# Patient Record
Sex: Female | Born: 1970 | Race: Black or African American | Hispanic: No | Marital: Single | Smoking: Never smoker
Health system: Southern US, Community
[De-identification: ages and names within clinical notes are randomized; demographics above are authoritative.]

## PROBLEM LIST (undated history)

## (undated) DIAGNOSIS — K529 Noninfective gastroenteritis and colitis, unspecified: Secondary | ICD-10-CM

## (undated) DIAGNOSIS — E119 Type 2 diabetes mellitus without complications: Secondary | ICD-10-CM

## (undated) DIAGNOSIS — I10 Essential (primary) hypertension: Secondary | ICD-10-CM

## (undated) DIAGNOSIS — K859 Acute pancreatitis without necrosis or infection, unspecified: Secondary | ICD-10-CM

## (undated) DIAGNOSIS — M1990 Unspecified osteoarthritis, unspecified site: Secondary | ICD-10-CM

## (undated) DIAGNOSIS — N3010 Interstitial cystitis (chronic) without hematuria: Secondary | ICD-10-CM

## (undated) DIAGNOSIS — G43909 Migraine, unspecified, not intractable, without status migrainosus: Secondary | ICD-10-CM

## (undated) DIAGNOSIS — K219 Gastro-esophageal reflux disease without esophagitis: Secondary | ICD-10-CM

## (undated) DIAGNOSIS — G2581 Restless legs syndrome: Secondary | ICD-10-CM

## (undated) DIAGNOSIS — E669 Obesity, unspecified: Secondary | ICD-10-CM

## (undated) HISTORY — PX: APPENDECTOMY: SHX54

## (undated) HISTORY — PX: CHOLECYSTECTOMY: SHX55

## (undated) HISTORY — PX: ABDOMINAL HYSTERECTOMY: SHX81

---

## 2012-05-30 ENCOUNTER — Emergency Department (HOSPITAL_BASED_OUTPATIENT_CLINIC_OR_DEPARTMENT_OTHER)
Admission: EM | Admit: 2012-05-30 | Discharge: 2012-05-30 | Disposition: A | Discharge status: Home / Self Care | Payer: No Typology Code available for payment source | Attending: Emergency Medicine | Admitting: Emergency Medicine

## 2012-05-30 ENCOUNTER — Encounter (HOSPITAL_BASED_OUTPATIENT_CLINIC_OR_DEPARTMENT_OTHER): Payer: Self-pay | Admitting: *Deleted

## 2012-05-30 ENCOUNTER — Emergency Department (HOSPITAL_BASED_OUTPATIENT_CLINIC_OR_DEPARTMENT_OTHER): Payer: No Typology Code available for payment source

## 2012-05-30 DIAGNOSIS — M7989 Other specified soft tissue disorders: Secondary | ICD-10-CM | POA: Insufficient documentation

## 2012-05-30 DIAGNOSIS — R51 Headache: Secondary | ICD-10-CM | POA: Insufficient documentation

## 2012-05-30 DIAGNOSIS — T1490XA Injury, unspecified, initial encounter: Secondary | ICD-10-CM | POA: Insufficient documentation

## 2012-05-30 DIAGNOSIS — I10 Essential (primary) hypertension: Secondary | ICD-10-CM | POA: Insufficient documentation

## 2012-05-30 DIAGNOSIS — R42 Dizziness and giddiness: Secondary | ICD-10-CM | POA: Insufficient documentation

## 2012-05-30 HISTORY — DX: Essential (primary) hypertension: I10

## 2012-05-30 HISTORY — DX: Restless legs syndrome: G25.81

## 2012-05-30 HISTORY — DX: Gastro-esophageal reflux disease without esophagitis: K21.9

## 2012-05-30 LAB — BASIC METABOLIC PANEL
BUN: 14 mg/dL (ref 6–23)
CO2: 29 mEq/L (ref 19–32)
Calcium: 9.4 mg/dL (ref 8.4–10.5)
GFR calc non Af Amer: >90 mL/min (ref >90)
Glucose, Bld: 105 mg/dL — ABNORMAL HIGH (ref 70–99)

## 2012-05-30 MED ORDER — ENOXAPARIN SODIUM 150 MG/ML ~~LOC~~ SOLN
1.0000 mg/kg | Freq: Once | SUBCUTANEOUS | Status: AC
  Administered 2012-05-30: 102 mg via SUBCUTANEOUS

## 2012-05-30 MED ORDER — METHOCARBAMOL 500 MG PO TABS: 1000.0000 mg | ORAL_TABLET | Freq: Four times a day (QID) | ORAL | Status: AC

## 2012-05-30 MED ORDER — ENOXAPARIN SODIUM 150 MG/ML ~~LOC~~ SOLN: 1.0000 mg/kg | Freq: Once | SUBCUTANEOUS | Status: DC

## 2012-05-30 MED ORDER — ENOXAPARIN SODIUM 120 MG/0.8ML ~~LOC~~ SOLN
SUBCUTANEOUS | Status: AC
  Filled 2012-05-30: qty 0.8
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
## 2012-05-31 ENCOUNTER — Ambulatory Visit (HOSPITAL_BASED_OUTPATIENT_CLINIC_OR_DEPARTMENT_OTHER)
Admit: 2012-05-31 | Discharge: 2012-05-31 | Disposition: A | Discharge status: Home / Self Care | Payer: Medicaid Other | Attending: Emergency Medicine | Admitting: Emergency Medicine

## 2012-05-31 DIAGNOSIS — M7989 Other specified soft tissue disorders: Secondary | ICD-10-CM

## 2012-05-31 DIAGNOSIS — R609 Edema, unspecified: Secondary | ICD-10-CM | POA: Insufficient documentation

## 2012-06-03 ENCOUNTER — Telehealth (HOSPITAL_BASED_OUTPATIENT_CLINIC_OR_DEPARTMENT_OTHER): Payer: Self-pay | Admitting: *Deleted

## 2012-06-04 NOTE — ED Notes (Signed)
Called pt to give normal [place] result.

## 2012-06-06 NOTE — ED Provider Notes (Signed)
Medical screening examination/treatment/procedure(s) were performed by non-physician practitioner and as supervising physician I was immediately available for consultation/collaboration.  Coyle Stordahl, MD 06/06/12 0000 

## 2013-01-25 ENCOUNTER — Encounter (HOSPITAL_BASED_OUTPATIENT_CLINIC_OR_DEPARTMENT_OTHER): Payer: Self-pay

## 2013-01-25 ENCOUNTER — Emergency Department (HOSPITAL_BASED_OUTPATIENT_CLINIC_OR_DEPARTMENT_OTHER)
Admission: EM | Admit: 2013-01-25 | Discharge: 2013-01-25 | Disposition: A | Discharge status: Home / Self Care | Payer: Medicaid Other | Attending: Emergency Medicine | Admitting: Emergency Medicine

## 2013-01-25 ENCOUNTER — Emergency Department (HOSPITAL_BASED_OUTPATIENT_CLINIC_OR_DEPARTMENT_OTHER): Payer: Medicaid Other

## 2013-01-25 DIAGNOSIS — R51 Headache: Secondary | ICD-10-CM | POA: Insufficient documentation

## 2013-01-25 DIAGNOSIS — M25519 Pain in unspecified shoulder: Secondary | ICD-10-CM | POA: Insufficient documentation

## 2013-01-25 DIAGNOSIS — Z8739 Personal history of other diseases of the musculoskeletal system and connective tissue: Secondary | ICD-10-CM | POA: Insufficient documentation

## 2013-01-25 DIAGNOSIS — E669 Obesity, unspecified: Secondary | ICD-10-CM | POA: Insufficient documentation

## 2013-01-25 DIAGNOSIS — K219 Gastro-esophageal reflux disease without esophagitis: Secondary | ICD-10-CM | POA: Insufficient documentation

## 2013-01-25 DIAGNOSIS — Z79899 Other long term (current) drug therapy: Secondary | ICD-10-CM | POA: Insufficient documentation

## 2013-01-25 DIAGNOSIS — I10 Essential (primary) hypertension: Secondary | ICD-10-CM | POA: Insufficient documentation

## 2013-01-25 DIAGNOSIS — M542 Cervicalgia: Secondary | ICD-10-CM | POA: Insufficient documentation

## 2013-01-25 HISTORY — DX: Obesity, unspecified: E66.9

## 2013-01-25 LAB — BASIC METABOLIC PANEL
BUN: 10 mg/dL (ref 6–23)
Creatinine, Ser: 0.8 mg/dL (ref 0.50–1.10)
GFR calc Af Amer: >90 mL/min (ref >90)
GFR calc non Af Amer: >90 mL/min (ref >90)
Glucose, Bld: 81 mg/dL (ref 70–99)
Potassium: 3.8 mEq/L (ref 3.5–5.1)

## 2013-01-25 MED ORDER — HYDROCODONE-ACETAMINOPHEN 5-325 MG PO TABS: 2.0000 | ORAL_TABLET | Freq: Four times a day (QID) | ORAL | Status: DC | PRN

## 2013-01-25 MED ORDER — ONDANSETRON HCL 4 MG/2ML IJ SOLN
4.0000 mg | Freq: Once | INTRAMUSCULAR | Status: AC
  Administered 2013-01-25: 4 mg via INTRAVENOUS
  Filled 2013-01-25: qty 2

## 2013-01-25 MED ORDER — HYDROMORPHONE HCL PF 1 MG/ML IJ SOLN
1.0000 mg | Freq: Once | INTRAMUSCULAR | Status: AC
  Administered 2013-01-25: 1 mg via INTRAVENOUS
  Filled 2013-01-25: qty 1

## 2013-01-25 MED ORDER — METOCLOPRAMIDE HCL 10 MG PO TABS: 10.0000 mg | ORAL_TABLET | Freq: Four times a day (QID) | ORAL | Status: DC

## 2013-01-25 NOTE — ED Provider Notes (Signed)
History  This chart was scribed for Charles B. Bernette Mayers, MD by Shari Heritage, ED Scribe. The patient was seen in room MH05/MH05. Patient's care was started at 1721.   CSN: [id number]  Arrival date & time 01/25/13  1644   First MD Initiated Contact with Patient 01/25/13 1721      Chief Complaint  Patient presents with  . Headache    The history is provided by the patient. No language interpreter was used.    HPI Comments: Selena Hopkins is a 42 y.o. female who presents to the Emergency Department complaining of gradual, moderate to severe, constant, diffuse headache onset 2 days ago. She also reports right neck and shoulder pain that began at the same times. Patient says that she was doing normal, daily activities when pain began. She states that she also began having nausea and vomiting this morning.There is no hematemesis. Patient denies fever or abdominal pain. Patient took Tylenol this morning with no relief. Patient says that she hadn't had a headache for 6-7 months prior to this episode and past headaches have never been this severe. She denies any recent falls, injuries or trauma. She has a history of hypertension, GERD and pancreatitis.    Past Medical History  Diagnosis Date  . GERD (gastroesophageal reflux disease)   . Hypertension   . Restless leg syndrome   . Obesity     Past Surgical History  Procedure Laterality Date  . Cholecystectomy    . Abdominal hysterectomy    . Appendectomy      History reviewed. No pertinent family history.  History  Substance Use Topics  . Smoking status: Never Smoker   . Smokeless tobacco: Not on file  . Alcohol Use: No    OB History   Grav Para Term Preterm Abortions TAB SAB Ect Mult Living                  Review of Systems A complete 10 system review of systems was obtained and all systems are negative except as noted in the HPI and PMH.    Allergies  Aspirin and Ibuprofen  Home Medications   Current Outpatient Rx  Name   Route  Sig  Dispense  Refill  . acetaminophen (TYLENOL) 500 MG tablet   Oral   Take 500 mg by mouth every 6 (six) hours as needed for pain.         Marland Kitchen dicyclomine (BENTYL) 10 MG capsule   Oral   Take 10 mg by mouth every 4 (four) hours as needed.         . hydrochlorothiazide (HYDRODIURIL) 25 MG tablet   Oral   Take 25 mg by mouth daily.         Marland Kitchen lisinopril (PRINIVIL,ZESTRIL) 5 MG tablet   Oral   Take 5 mg by mouth daily.         . metoCLOPramide (REGLAN) 10 MG tablet   Oral   Take 10 mg by mouth 4 (four) times daily.         . pantoprazole (PROTONIX) 40 MG tablet   Oral   Take 40 mg by mouth daily.         Marland Kitchen rOPINIRole (REQUIP) 0.5 MG tablet   Oral   Take 0.5 mg by mouth 3 (three) times daily.         . TRAZODONE HCL PO   Oral   Take by mouth at bedtime as needed.  Triage Vitals: BP 134/72  Pulse 80  Temp(Src) 98.5 F (36.9 C) (Oral)  Resp 18  Ht 5\' 4"  (1.626 m)  Wt 225 lb (102.059 kg)  BMI 38.6 kg/m2  SpO2 99%  Physical Exam  Nursing note and vitals reviewed. Constitutional: She is oriented to person, place, and time. She appears well-developed and well-nourished.  HENT:  Head: Normocephalic and atraumatic.  Eyes: EOM are normal. Pupils are equal, round, and reactive to light.  Neck: Normal range of motion. Neck supple.  Cardiovascular: Normal rate, normal heart sounds and intact distal pulses.   Pulmonary/Chest: Effort normal and breath sounds normal.  Abdominal: Bowel sounds are normal. She exhibits no distension. There is no tenderness.  Musculoskeletal: Normal range of motion. She exhibits no edema and no tenderness.  Neurological: She is alert and oriented to person, place, and time. She has normal strength and normal reflexes. No cranial nerve deficit or sensory deficit.  Skin: Skin is warm and dry. No rash noted.  Psychiatric: She has a normal mood and affect.    ED Course  Procedures (including critical care  time) DIAGNOSTIC STUDIES: Oxygen Saturation is 99% on room air, normal by my interpretation.    COORDINATION OF CARE: 5:31 PM- Patient informed of current plan for treatment and evaluation and agrees with plan at this time.    Labs Reviewed  BASIC METABOLIC PANEL    Ct Head Wo Contrast  01/25/2013  *RADIOLOGY REPORT*  Clinical Data: headache, vomiting  CT HEAD WITHOUT CONTRAST  Technique:  Contiguous axial images were obtained from the base of the skull through the vertex without contrast.  Comparison:05/30/12  Findings: Calvarium intact.  No hemorrhage or extraaxial fluid.  No mass, infarct, or hydrocephalus.  Normal sulcation.  IMPRESSION: Normal CT of the head.   Original Report Authenticated By: Esperanza Heir, M.D.      No diagnosis found.    MDM  Pt feeling better, headache and nausea resolved. Doubt acute intracranial process given timing and gradual onset. No concern for CAH or meningitis. Advised to follow up with PCP as needed.       I personally performed the services described in this documentation, which was scribed in my presence. The recorded information has been reviewed and is accurate.     Charles B. Bernette Mayers, MD 01/25/13 2018

## 2013-01-25 NOTE — ED Notes (Signed)
Pt states that she has severe headache, R neck and R shoulder pain.  Pt states that she had taken tylenol for the pain with no relief, nausea and vomiting caused her not to keep medication down.

## 2013-11-18 ENCOUNTER — Emergency Department (HOSPITAL_BASED_OUTPATIENT_CLINIC_OR_DEPARTMENT_OTHER)
Admission: EM | Admit: 2013-11-18 | Discharge: 2013-11-18 | Disposition: A | Discharge status: Home / Self Care | Payer: Medicaid Other | Attending: Emergency Medicine | Admitting: Emergency Medicine

## 2013-11-18 ENCOUNTER — Encounter (HOSPITAL_BASED_OUTPATIENT_CLINIC_OR_DEPARTMENT_OTHER): Payer: Self-pay | Admitting: Emergency Medicine

## 2013-11-18 ENCOUNTER — Emergency Department (HOSPITAL_BASED_OUTPATIENT_CLINIC_OR_DEPARTMENT_OTHER): Payer: Medicaid Other

## 2013-11-18 DIAGNOSIS — R109 Unspecified abdominal pain: Secondary | ICD-10-CM

## 2013-11-18 DIAGNOSIS — E669 Obesity, unspecified: Secondary | ICD-10-CM | POA: Insufficient documentation

## 2013-11-18 DIAGNOSIS — Z79899 Other long term (current) drug therapy: Secondary | ICD-10-CM | POA: Insufficient documentation

## 2013-11-18 DIAGNOSIS — K219 Gastro-esophageal reflux disease without esophagitis: Secondary | ICD-10-CM | POA: Insufficient documentation

## 2013-11-18 DIAGNOSIS — Z90710 Acquired absence of both cervix and uterus: Secondary | ICD-10-CM | POA: Insufficient documentation

## 2013-11-18 DIAGNOSIS — R1084 Generalized abdominal pain: Secondary | ICD-10-CM | POA: Insufficient documentation

## 2013-11-18 DIAGNOSIS — R110 Nausea: Secondary | ICD-10-CM | POA: Insufficient documentation

## 2013-11-18 DIAGNOSIS — Z9089 Acquired absence of other organs: Secondary | ICD-10-CM | POA: Insufficient documentation

## 2013-11-18 DIAGNOSIS — K589 Irritable bowel syndrome without diarrhea: Secondary | ICD-10-CM | POA: Insufficient documentation

## 2013-11-18 DIAGNOSIS — E119 Type 2 diabetes mellitus without complications: Secondary | ICD-10-CM | POA: Insufficient documentation

## 2013-11-18 DIAGNOSIS — I10 Essential (primary) hypertension: Secondary | ICD-10-CM | POA: Insufficient documentation

## 2013-11-18 DIAGNOSIS — G2581 Restless legs syndrome: Secondary | ICD-10-CM | POA: Insufficient documentation

## 2013-11-18 DIAGNOSIS — Z8711 Personal history of peptic ulcer disease: Secondary | ICD-10-CM | POA: Insufficient documentation

## 2013-11-18 HISTORY — DX: Acute pancreatitis without necrosis or infection, unspecified: K85.90

## 2013-11-18 HISTORY — DX: Noninfective gastroenteritis and colitis, unspecified: K52.9

## 2013-11-18 HISTORY — DX: Type 2 diabetes mellitus without complications: E11.9

## 2013-11-18 LAB — COMPREHENSIVE METABOLIC PANEL
Albumin: 3.8 g/dL (ref 3.5–5.2)
Alkaline Phosphatase: 51 U/L (ref 39–117)
BUN: 12 mg/dL (ref 6–23)
CO2: 28 mEq/L (ref 19–32)
Chloride: 101 mEq/L (ref 96–112)
Creatinine, Ser: 0.9 mg/dL (ref 0.50–1.10)
GFR calc Af Amer: >90 mL/min (ref >90)
GFR calc non Af Amer: 78 mL/min — ABNORMAL LOW (ref >90)
Glucose, Bld: 92 mg/dL (ref 70–99)
Potassium: 3.8 mEq/L (ref 3.5–5.1)
Total Bilirubin: 0.2 mg/dL — ABNORMAL LOW (ref 0.3–1.2)

## 2013-11-18 LAB — URINALYSIS, ROUTINE W REFLEX MICROSCOPIC
Bilirubin Urine: NEGATIVE
Glucose, UA: NEGATIVE mg/dL
Ketones, ur: NEGATIVE mg/dL
Leukocytes, UA: NEGATIVE
Nitrite: NEGATIVE
Protein, ur: NEGATIVE mg/dL
Specific Gravity, Urine: 1.019 (ref 1.005–1.030)
Urobilinogen, UA: 0.2 mg/dL (ref 0.0–1.0)
pH: 6.5 (ref 5.0–8.0)

## 2013-11-18 LAB — CBC WITH DIFFERENTIAL/PLATELET
Basophils Relative: 0 % (ref 0–1)
Eosinophils Relative: 2 % (ref 0–5)
HCT: 33.8 % — ABNORMAL LOW (ref 36.0–46.0)
Hemoglobin: 11.5 g/dL — ABNORMAL LOW (ref 12.0–15.0)
Lymphocytes Relative: 46 % (ref 12–46)
Neutro Abs: 3.3 10*3/uL (ref 1.7–7.7)
Neutrophils Relative %: 44 % (ref 43–77)
RBC: 5.26 MIL/uL — ABNORMAL HIGH (ref 3.87–5.11)

## 2013-11-18 LAB — URINE MICROSCOPIC-ADD ON

## 2013-11-18 MED ORDER — PROMETHAZINE HCL 25 MG PO TABS: 25.0000 mg | ORAL_TABLET | Freq: Four times a day (QID) | ORAL | Status: DC | PRN

## 2013-11-18 MED ORDER — ONDANSETRON HCL 4 MG/2ML IJ SOLN
4.0000 mg | Freq: Once | INTRAMUSCULAR | Status: AC
  Administered 2013-11-18: 4 mg via INTRAMUSCULAR
  Filled 2013-11-18: qty 2

## 2013-11-18 MED ORDER — IOHEXOL 300 MG/ML  SOLN
100.0000 mL | Freq: Once | INTRAMUSCULAR | Status: AC | PRN
  Administered 2013-11-18: 100 mL via INTRAVENOUS

## 2013-11-18 MED ORDER — IOHEXOL 300 MG/ML  SOLN
50.0000 mL | Freq: Once | INTRAMUSCULAR | Status: AC | PRN
  Administered 2013-11-18: 50 mL via ORAL

## 2013-11-18 MED ORDER — HYDROMORPHONE HCL PF 1 MG/ML IJ SOLN
1.0000 mg | Freq: Once | INTRAMUSCULAR | Status: AC
  Administered 2013-11-18: 1 mg via INTRAVENOUS
  Filled 2013-11-18: qty 1

## 2013-11-18 MED ORDER — SODIUM CHLORIDE 0.9 % IV SOLN
Freq: Once | INTRAVENOUS | Status: AC
  Administered 2013-11-18: 18:00:00 via INTRAVENOUS

## 2013-11-18 MED ORDER — HYDROCODONE-ACETAMINOPHEN 5-325 MG PO TABS: 2.0000 | ORAL_TABLET | ORAL | Status: DC | PRN

## 2013-11-18 NOTE — ED Notes (Signed)
Pt. Drove self to ED.  Pt. Will try to get a ride home.  If Pt. Finds a ride she will let RN know so she can have pain meds.

## 2013-11-18 NOTE — ED Notes (Signed)
Pt drove self here to ED and will not get Dilaudid.

## 2013-11-18 NOTE — ED Notes (Signed)
Abdominal pain with nausea since this afternoon.  No diarrhea or fever.  No dysuria.

## 2013-11-18 NOTE — ED Provider Notes (Signed)
CSN: [id number]     Arrival date & time 11/18/13  1553 History   First MD Initiated Contact with Patient 11/18/13 1703     Chief Complaint  Patient presents with  . Abdominal Pain   (Consider location/radiation/quality/duration/timing/severity/associated sxs/prior Treatment) Patient is a 42 y.o. female presenting with abdominal pain. The history is provided by the patient. No language interpreter was used.  Abdominal Pain Pain location:  Generalized Pain radiates to:  Does not radiate Pain severity:  Moderate Timing:  Constant Progression:  Worsening Chronicity:  New Context: recent illness   Context: not alcohol use and not sick contacts   Relieved by:  Nothing Worsened by:  Nothing tried Ineffective treatments:  None tried Associated symptoms: nausea   Associated symptoms: no diarrhea, no fever and no vomiting   Risk factors: no alcohol abuse   Pt reports she has a history of a perforated ulcer.   Pt reports she is having similar pain.  Pt reports pain.    Past Medical History  Diagnosis Date  . GERD (gastroesophageal reflux disease)   . Hypertension   . Restless leg syndrome   . Obesity   . Diabetes mellitus without complication   . Inflammatory bowel diseases (IBD)   . Pancreatitis    Past Surgical History  Procedure Laterality Date  . Cholecystectomy    . Abdominal hysterectomy    . Appendectomy     No family history on file. History  Substance Use Topics  . Smoking status: Never Smoker   . Smokeless tobacco: Not on file  . Alcohol Use: No   OB History   Grav Para Term Preterm Abortions TAB SAB Ect Mult Living                 Review of Systems  Constitutional: Negative for fever.  Gastrointestinal: Positive for nausea and abdominal pain. Negative for vomiting and diarrhea.  All other systems reviewed and are negative.    Allergies  Aspirin and Ibuprofen  Home Medications   Current Outpatient Rx  Name  Route  Sig  Dispense  Refill  .  dicyclomine (BENTYL) 10 MG capsule   Oral   Take 10 mg by mouth every 4 (four) hours as needed.         . hydrochlorothiazide (HYDRODIURIL) 25 MG tablet   Oral   Take 25 mg by mouth daily.         Marland Kitchen lisinopril (PRINIVIL,ZESTRIL) 5 MG tablet   Oral   Take 5 mg by mouth daily.         Marland Kitchen lubiprostone (AMITIZA) 24 MCG capsule   Oral   Take 24 mcg by mouth 2 (two) times daily with a meal.         . metFORMIN (GLUCOPHAGE) 500 MG tablet   Oral   Take by mouth 2 (two) times daily with a meal.         . metoCLOPramide (REGLAN) 10 MG tablet   Oral   Take 1 tablet (10 mg total) by mouth 4 (four) times daily.   10 tablet   0   . pantoprazole (PROTONIX) 40 MG tablet   Oral   Take 40 mg by mouth daily.         Marland Kitchen rOPINIRole (REQUIP) 0.5 MG tablet   Oral   Take 0.5 mg by mouth 3 (three) times daily.         . TRAZODONE HCL PO   Oral   Take by mouth at bedtime  as needed.         . Vitamin D, Ergocalciferol, (DRISDOL) 50000 UNITS CAPS capsule   Oral   Take 50,000 Units by mouth every 7 (seven) days.         Marland Kitchen acetaminophen (TYLENOL) 500 MG tablet   Oral   Take 500 mg by mouth every 6 (six) hours as needed for pain.         Marland Kitchen HYDROcodone-acetaminophen (NORCO/VICODIN) 5-325 MG per tablet   Oral   Take 2 tablets by mouth every 6 (six) hours as needed for pain.   30 tablet   0    BP 160/86  Pulse 81  Temp(Src) 98.6 F (37 C) (Oral)  Resp 16  Wt 225 lb (102.059 kg)  SpO2 98% Physical Exam  Nursing note and vitals reviewed. Constitutional: She is oriented to person, place, and time. She appears well-developed and well-nourished.  HENT:  Head: Normocephalic and atraumatic.  Right Ear: External ear normal.  Left Ear: External ear normal.  Nose: Nose normal.  Mouth/Throat: Oropharynx is clear and moist.  Eyes: Conjunctivae and EOM are normal. Pupils are equal, round, and reactive to light.  Neck: Normal range of motion. Neck supple.  Cardiovascular:  Normal rate and normal heart sounds.   Pulmonary/Chest: Effort normal and breath sounds normal.  Abdominal: Soft. There is tenderness. There is guarding.  Musculoskeletal: Normal range of motion.  Neurological: She is alert and oriented to person, place, and time. She has normal reflexes.  Skin: Skin is warm.  Psychiatric: She has a normal mood and affect.    ED Course  Procedures (including critical care time) Labs Review Labs Reviewed  URINALYSIS, ROUTINE W REFLEX MICROSCOPIC - Abnormal; Notable for the following:    APPearance CLOUDY (*)    Hgb urine dipstick SMALL (*)    All other components within normal limits  CBC WITH DIFFERENTIAL - Abnormal; Notable for the following:    RBC 5.26 (*)    Hemoglobin 11.5 (*)    HCT 33.8 (*)    MCV 64.3 (*)    MCH 21.9 (*)    All other components within normal limits  COMPREHENSIVE METABOLIC PANEL - Abnormal; Notable for the following:    Total Bilirubin 0.2 (*)    GFR calc non Af Amer 78 (*)    All other components within normal limits  URINE MICROSCOPIC-ADD ON   Imaging Review No results found.  EKG Interpretation   None       MDM   1. Abdominal pain    Labs reviewed,  Ct scan is normal.   Pt advised to follow up with her MD for recheck.    Rx for hydrocodone and phenergan.       Lonia Skinner McGovern, PA-C 11/18/13 2034

## 2013-11-18 NOTE — ED Notes (Signed)
Pt. Reports she found a ride home.

## 2013-11-18 NOTE — ED Notes (Signed)
PA at bedside giving test results and discussing plan of care for DC. 

## 2013-11-19 NOTE — ED Provider Notes (Signed)
Medical screening examination/treatment/procedure(s) were performed by non-physician practitioner and as supervising physician I was immediately available for consultation/collaboration.  EKG Interpretation   None        Rainier Feuerborn F Bryon Parker, MD 11/19/13 1447 

## 2014-01-29 ENCOUNTER — Encounter (HOSPITAL_BASED_OUTPATIENT_CLINIC_OR_DEPARTMENT_OTHER): Payer: Self-pay | Admitting: Emergency Medicine

## 2014-01-29 ENCOUNTER — Emergency Department (HOSPITAL_BASED_OUTPATIENT_CLINIC_OR_DEPARTMENT_OTHER)
Admission: EM | Admit: 2014-01-29 | Discharge: 2014-01-30 | Disposition: A | Discharge status: Home / Self Care | Payer: Medicaid Other | Attending: Emergency Medicine | Admitting: Emergency Medicine

## 2014-01-29 DIAGNOSIS — G43809 Other migraine, not intractable, without status migrainosus: Secondary | ICD-10-CM | POA: Insufficient documentation

## 2014-01-29 DIAGNOSIS — Z79899 Other long term (current) drug therapy: Secondary | ICD-10-CM | POA: Insufficient documentation

## 2014-01-29 DIAGNOSIS — K219 Gastro-esophageal reflux disease without esophagitis: Secondary | ICD-10-CM | POA: Insufficient documentation

## 2014-01-29 DIAGNOSIS — E119 Type 2 diabetes mellitus without complications: Secondary | ICD-10-CM | POA: Insufficient documentation

## 2014-01-29 DIAGNOSIS — E669 Obesity, unspecified: Secondary | ICD-10-CM | POA: Insufficient documentation

## 2014-01-29 DIAGNOSIS — I10 Essential (primary) hypertension: Secondary | ICD-10-CM | POA: Insufficient documentation

## 2014-01-29 DIAGNOSIS — G2581 Restless legs syndrome: Secondary | ICD-10-CM | POA: Insufficient documentation

## 2014-01-29 DIAGNOSIS — G43901 Migraine, unspecified, not intractable, with status migrainosus: Secondary | ICD-10-CM

## 2014-01-29 HISTORY — DX: Hypercalcemia: E83.52

## 2014-01-29 HISTORY — DX: Migraine, unspecified, not intractable, without status migrainosus: G43.909

## 2014-01-29 NOTE — ED Notes (Signed)
Migraines x2 weeks.  Has been to pmd twice.  Got Toradol IM yesterday but it didn't help.

## 2014-01-30 MED ORDER — SODIUM CHLORIDE 0.9 % IV BOLUS (SEPSIS)
1000.0000 mL | Freq: Once | INTRAVENOUS | Status: AC
  Administered 2014-01-30: 1000 mL via INTRAVENOUS

## 2014-01-30 MED ORDER — VALPROATE SODIUM 500 MG/5ML IV SOLN
INTRAVENOUS | Status: AC
  Administered 2014-01-30: 500 mg
  Filled 2014-01-30: qty 5

## 2014-01-30 MED ORDER — DEXTROSE 5 % IV SOLN: 500.0000 mg | Freq: Once | INTRAVENOUS | Status: AC

## 2014-01-30 NOTE — ED Provider Notes (Signed)
CSN: [id number]     Arrival date & time 01/29/14  2317 History   First MD Initiated Contact with Patient 01/30/14 0103     Chief Complaint  Patient presents with  . Headache     (Consider location/radiation/quality/duration/timing/severity/associated sxs/prior Treatment) HPI This is a 43 year old female with a history of migraines. She is here with a two-week migraine which she describes as pressure over her entire head. The pain worsened yesterday and she was seen by her primary care physician. She was given a shot of Toradol without relief. The pain has been associated with nausea, vomiting and photophobia. There is no associated focal neurologic deficit. She states the pain is moderate to severe. Her pain is usually relieved by an infusion of Depakote.  Past Medical History  Diagnosis Date  . GERD (gastroesophageal reflux disease)   . Hypertension   . Restless leg syndrome   . Obesity   . Diabetes mellitus without complication   . Inflammatory bowel diseases (IBD)   . Pancreatitis   . Migraine   . Hypercalcemia    Past Surgical History  Procedure Laterality Date  . Cholecystectomy    . Abdominal hysterectomy    . Appendectomy     No family history on file. History  Substance Use Topics  . Smoking status: Never Smoker   . Smokeless tobacco: Not on file  . Alcohol Use: No   OB History   Grav Para Term Preterm Abortions TAB SAB Ect Mult Living                 Review of Systems  All other systems reviewed and are negative.   Allergies  Aspirin and Ibuprofen  Home Medications   Current Outpatient Rx  Name  Route  Sig  Dispense  Refill  . topiramate (TOPAMAX) 50 MG tablet   Oral   Take 50 mg by mouth 2 (two) times daily.         Marland Kitchen. acetaminophen (TYLENOL) 500 MG tablet   Oral   Take 500 mg by mouth every 6 (six) hours as needed for pain.         Marland Kitchen. dicyclomine (BENTYL) 10 MG capsule   Oral   Take 10 mg by mouth every 4 (four) hours as needed.          . hydrochlorothiazide (HYDRODIURIL) 25 MG tablet   Oral   Take 25 mg by mouth daily.         Marland Kitchen. HYDROcodone-acetaminophen (NORCO/VICODIN) 5-325 MG per tablet   Oral   Take 2 tablets by mouth every 6 (six) hours as needed for pain.   30 tablet   0   . HYDROcodone-acetaminophen (NORCO/VICODIN) 5-325 MG per tablet   Oral   Take 2 tablets by mouth every 4 (four) hours as needed.   20 tablet   0   . lisinopril (PRINIVIL,ZESTRIL) 5 MG tablet   Oral   Take 5 mg by mouth daily.         Marland Kitchen. lubiprostone (AMITIZA) 24 MCG capsule   Oral   Take 24 mcg by mouth 2 (two) times daily with a meal.         . metFORMIN (GLUCOPHAGE) 500 MG tablet   Oral   Take by mouth 2 (two) times daily with a meal.         . metoCLOPramide (REGLAN) 10 MG tablet   Oral   Take 1 tablet (10 mg total) by mouth 4 (four) times daily.  10 tablet   0   . pantoprazole (PROTONIX) 40 MG tablet   Oral   Take 40 mg by mouth daily.         . promethazine (PHENERGAN) 25 MG tablet   Oral   Take 1 tablet (25 mg total) by mouth every 6 (six) hours as needed for nausea or vomiting.   10 tablet   0   . rOPINIRole (REQUIP) 0.5 MG tablet   Oral   Take 0.5 mg by mouth 3 (three) times daily.         . TRAZODONE HCL PO   Oral   Take by mouth at bedtime as needed.         . Vitamin D, Ergocalciferol, (DRISDOL) 50000 UNITS CAPS capsule   Oral   Take 50,000 Units by mouth every 7 (seven) days.          BP 133/72  Pulse 77  Temp(Src) 98.3 F (36.8 C) (Oral)  Resp 16  Ht 5\' 4"  (1.626 m)  Wt 264 lb (119.75 kg)  BMI 45.29 kg/m2  SpO2 100%  Physical Exam General: Well-developed, obese female sleeping in no acute distress; appearance consistent with age of record HENT: normocephalic; atraumatic Eyes: pupils equal, round and reactive to light; extraocular muscles intact Neck: supple Heart: regular rate and rhythm Lungs: clear to auscultation bilaterally Abdomen: soft; nondistended; nontender;  bowel sounds present Extremities: No deformity; full range of motion Neurologic: Awake, alert and oriented; motor function intact in all extremities and symmetric; no facial droop; normal coordination and speech; negative Romberg; normal finger to nose Skin: Warm and dry Psychiatric: Flat affect    ED Course  Procedures (including critical care time)   MDM   2:52 AM Migraine relieved with IV fluid and valproic acid bolus.    Hanley Seamen, MD 01/30/14 [phone]

## 2014-02-27 DIAGNOSIS — G43909 Migraine, unspecified, not intractable, without status migrainosus: Secondary | ICD-10-CM | POA: Insufficient documentation

## 2014-02-27 DIAGNOSIS — G2581 Restless legs syndrome: Secondary | ICD-10-CM | POA: Insufficient documentation

## 2014-02-27 DIAGNOSIS — K219 Gastro-esophageal reflux disease without esophagitis: Secondary | ICD-10-CM | POA: Insufficient documentation

## 2014-02-27 DIAGNOSIS — E669 Obesity, unspecified: Secondary | ICD-10-CM | POA: Insufficient documentation

## 2014-02-27 DIAGNOSIS — I10 Essential (primary) hypertension: Secondary | ICD-10-CM | POA: Insufficient documentation

## 2014-02-27 DIAGNOSIS — E119 Type 2 diabetes mellitus without complications: Secondary | ICD-10-CM | POA: Insufficient documentation

## 2014-02-27 DIAGNOSIS — K5289 Other specified noninfective gastroenteritis and colitis: Secondary | ICD-10-CM | POA: Insufficient documentation

## 2014-02-27 DIAGNOSIS — G5600 Carpal tunnel syndrome, unspecified upper limb: Secondary | ICD-10-CM | POA: Insufficient documentation

## 2014-02-27 DIAGNOSIS — Z79899 Other long term (current) drug therapy: Secondary | ICD-10-CM | POA: Insufficient documentation

## 2014-02-28 ENCOUNTER — Encounter (HOSPITAL_BASED_OUTPATIENT_CLINIC_OR_DEPARTMENT_OTHER): Payer: Self-pay | Admitting: Emergency Medicine

## 2014-02-28 ENCOUNTER — Emergency Department (HOSPITAL_BASED_OUTPATIENT_CLINIC_OR_DEPARTMENT_OTHER)
Admission: EM | Admit: 2014-02-28 | Discharge: 2014-02-28 | Disposition: A | Discharge status: Home / Self Care | Payer: Medicaid Other | Attending: Emergency Medicine | Admitting: Emergency Medicine

## 2014-02-28 DIAGNOSIS — G5602 Carpal tunnel syndrome, left upper limb: Secondary | ICD-10-CM

## 2014-02-28 LAB — URINALYSIS, ROUTINE W REFLEX MICROSCOPIC
Bilirubin Urine: NEGATIVE
GLUCOSE, UA: NEGATIVE mg/dL
Ketones, ur: NEGATIVE mg/dL
LEUKOCYTES UA: NEGATIVE
Nitrite: NEGATIVE
PH: 6 (ref 5.0–8.0)
Protein, ur: NEGATIVE mg/dL
SPECIFIC GRAVITY, URINE: 1.024 (ref 1.005–1.030)
Urobilinogen, UA: 0.2 mg/dL (ref 0.0–1.0)

## 2014-02-28 LAB — URINE MICROSCOPIC-ADD ON

## 2014-02-28 LAB — CBG MONITORING, ED: GLUCOSE-CAPILLARY: 97 mg/dL (ref 70–99)

## 2014-02-28 NOTE — Discharge Instructions (Signed)

## 2014-02-28 NOTE — ED Notes (Signed)
Has attempted to provide a urine specimen without success.  Provided patient water on request.  Will try again shortly.

## 2014-02-28 NOTE — ED Notes (Signed)
Reports "feeling bad all day"- C/o dizziness, nausea, and left arm pain

## 2014-02-28 NOTE — ED Provider Notes (Signed)
CSN: [id number]     Arrival date & time 02/27/14  2358 History   None    Chief Complaint  Patient presents with  . 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 She is here with several days of pain in her left forearm. She describes the pain as sharp but cannot localize it. She states it is associated with paresthesias of her fingers and hand but she cannot localize the paresthesias. The pain is worse with movement of the wrist or elbow. There is no change with movement of the shoulder or neck. She is not having pain in the left upper arm. She is not aware of any acute trauma. She reported to nursing staff that she also felt nauseated and dizzy throughout the day yesterday.  Past Medical History  Diagnosis Date  . GERD (gastroesophageal reflux disease)   . Hypertension   . Restless leg syndrome   . Obesity   . Diabetes mellitus without complication   . Inflammatory bowel diseases (IBD)   . Pancreatitis   . Migraine   . Hypercalcemia    Past Surgical History  Procedure Laterality Date  . Cholecystectomy    . Abdominal hysterectomy    . Appendectomy     No family history on file. History  Substance Use Topics  . Smoking status: Never Smoker   . Smokeless tobacco: Not on file  . Alcohol Use: No   OB History   Grav Para Term Preterm Abortions TAB SAB Ect Mult Living                 Review of Systems  All other systems reviewed and are negative.   Allergies  Aspirin and Ibuprofen  Home Medications   Current Outpatient Rx  Name  Route  Sig  Dispense  Refill  . dicyclomine (BENTYL) 10 MG capsule   Oral   Take 10 mg by mouth every 4 (four) hours as needed.         . hydrochlorothiazide (HYDRODIURIL) 25 MG tablet   Oral   Take 25 mg by mouth daily.         Marland Kitchen lisinopril  (PRINIVIL,ZESTRIL) 5 MG tablet   Oral   Take 5 mg by mouth daily.         Marland Kitchen lubiprostone (AMITIZA) 24 MCG capsule   Oral   Take 24 mcg by mouth 2 (two) times daily with a meal.         . metFORMIN (GLUCOPHAGE) 500 MG tablet   Oral   Take by mouth 2 (two) times daily with a meal.         . metoCLOPramide (REGLAN) 10 MG tablet   Oral   Take 1 tablet (10 mg total) by mouth 4 (four) times daily.   10 tablet   0   . ondansetron (ZOFRAN-ODT) 4 MG disintegrating tablet   Oral   Take 4 mg by mouth every 8 (eight) hours as needed for nausea or vomiting.         . pantoprazole (PROTONIX) 40 MG tablet   Oral   Take 40 mg by mouth daily.         Marland Kitchen rOPINIRole (REQUIP) 0.5 MG tablet   Oral   Take 0.5 mg by mouth 3 (three) times daily.         Marland Kitchen topiramate (TOPAMAX) 50 MG  tablet   Oral   Take 50 mg by mouth 2 (two) times daily.         . TRAZODONE HCL PO   Oral   Take by mouth at bedtime as needed.         . Vitamin D, Ergocalciferol, (DRISDOL) 50000 UNITS CAPS capsule   Oral   Take 50,000 Units by mouth every 7 (seven) days.         Marland Kitchen acetaminophen (TYLENOL) 500 MG tablet   Oral   Take 500 mg by mouth every 6 (six) hours as needed for pain.         Marland Kitchen HYDROcodone-acetaminophen (NORCO/VICODIN) 5-325 MG per tablet   Oral   Take 2 tablets by mouth every 6 (six) hours as needed for pain.   30 tablet   0   . HYDROcodone-acetaminophen (NORCO/VICODIN) 5-325 MG per tablet   Oral   Take 2 tablets by mouth every 4 (four) hours as needed.   20 tablet   0   . promethazine (PHENERGAN) 25 MG tablet   Oral   Take 1 tablet (25 mg total) by mouth every 6 (six) hours as needed for nausea or vomiting.   10 tablet   0    BP 117/70  Pulse 87  Temp(Src) 98.7 F (37.1 C) (Oral)  Resp 20  Ht 5\' 4"  (1.626 m)  Wt 265 lb (120.203 kg)  BMI 45.46 kg/m2  SpO2 100%  Physical Exam General: Well-developed, well-nourished female in no acute distress; appearance  consistent with age of record HENT: normocephalic; atraumatic Eyes: pupils equal, round and reactive to light; extraocular muscles intact Neck: supple Heart: regular rate and rhythm Lungs: clear to auscultation bilaterally Abdomen: soft; nondistended; nontender; bowel sounds present Extremities: No deformity; full range of motion; pulses normal; soft tissue tenderness of the left forearm without erythema, warmth, swelling or cyanosis; positive left Tinel's test, positive left Phalen's test Neurologic: Awake, alert and oriented; motor function intact in all extremities and symmetric; no facial droop Skin: Warm and dry Psychiatric: Normal mood and affect    ED Course  Procedures (including critical care time)   MDM   Nursing notes and vitals signs, including pulse oximetry, reviewed.  Summary of this visit's results, reviewed by myself:  Labs:  Results for orders placed during the hospital encounter of 02/28/14 (from the past 24 hour(s))  CBG MONITORING, ED     Status: None   Collection Time    02/28/14 12:22 AM      Result Value Ref Range   Glucose-Capillary 97  70 - 99 mg/dL  URINALYSIS, ROUTINE W REFLEX MICROSCOPIC     Status: Abnormal   Collection Time    02/28/14  2:00 AM      Result Value Ref Range   Color, Urine YELLOW  YELLOW   APPearance CLOUDY (*) CLEAR   Specific Gravity, Urine 1.024  1.005 - 1.030   pH 6.0  5.0 - 8.0   Glucose, UA NEGATIVE  NEGATIVE mg/dL   Hgb urine dipstick TRACE (*) NEGATIVE   Bilirubin Urine NEGATIVE  NEGATIVE   Ketones, ur NEGATIVE  NEGATIVE mg/dL   Protein, ur NEGATIVE  NEGATIVE mg/dL   Urobilinogen, UA 0.2  0.0 - 1.0 mg/dL   Nitrite NEGATIVE  NEGATIVE   Leukocytes, UA NEGATIVE  NEGATIVE  URINE MICROSCOPIC-ADD ON     Status: Abnormal   Collection Time    02/28/14  2:00 AM      Result Value Ref Range  Squamous Epithelial / LPF MANY (*) RARE   WBC, UA 0-2  <3 WBC/hpf   RBC / HPF 3-6  <3 RBC/hpf   Bacteria, UA MANY (*) RARE    Urine-Other MUCOUS PRESENT     2:29 AM Exam is suspicious for carpal tunnel syndrome. Even though the patient is vague about the location of her symptoms her Tinel's and Phalen's tests were positive     Hanley SeamenJohn L Viridiana Spaid, MD 02/28/14 [phone]

## 2014-02-28 NOTE — ED Notes (Signed)
D/c home- velcro splint given for home use

## 2015-01-01 ENCOUNTER — Encounter (HOSPITAL_BASED_OUTPATIENT_CLINIC_OR_DEPARTMENT_OTHER): Payer: Self-pay

## 2015-01-01 ENCOUNTER — Emergency Department (HOSPITAL_BASED_OUTPATIENT_CLINIC_OR_DEPARTMENT_OTHER)
Admission: EM | Admit: 2015-01-01 | Discharge: 2015-01-01 | Disposition: A | Discharge status: Home / Self Care | Payer: Medicare Other | Attending: Emergency Medicine | Admitting: Emergency Medicine

## 2015-01-01 DIAGNOSIS — E669 Obesity, unspecified: Secondary | ICD-10-CM | POA: Insufficient documentation

## 2015-01-01 DIAGNOSIS — I10 Essential (primary) hypertension: Secondary | ICD-10-CM | POA: Diagnosis not present

## 2015-01-01 DIAGNOSIS — E119 Type 2 diabetes mellitus without complications: Secondary | ICD-10-CM | POA: Insufficient documentation

## 2015-01-01 DIAGNOSIS — G43909 Migraine, unspecified, not intractable, without status migrainosus: Secondary | ICD-10-CM | POA: Insufficient documentation

## 2015-01-01 DIAGNOSIS — G2581 Restless legs syndrome: Secondary | ICD-10-CM | POA: Insufficient documentation

## 2015-01-01 DIAGNOSIS — R51 Headache: Secondary | ICD-10-CM | POA: Diagnosis present

## 2015-01-01 DIAGNOSIS — K219 Gastro-esophageal reflux disease without esophagitis: Secondary | ICD-10-CM | POA: Insufficient documentation

## 2015-01-01 DIAGNOSIS — Z79899 Other long term (current) drug therapy: Secondary | ICD-10-CM | POA: Insufficient documentation

## 2015-01-01 MED ORDER — METOCLOPRAMIDE HCL 5 MG/ML IJ SOLN
10.0000 mg | Freq: Once | INTRAMUSCULAR | Status: AC
  Administered 2015-01-01: 10 mg via INTRAVENOUS
  Filled 2015-01-01: qty 2

## 2015-01-01 MED ORDER — DIPHENHYDRAMINE HCL 50 MG/ML IJ SOLN
25.0000 mg | Freq: Once | INTRAMUSCULAR | Status: AC
  Administered 2015-01-01: 25 mg via INTRAVENOUS
  Filled 2015-01-01: qty 1

## 2015-01-01 MED ORDER — KETOROLAC TROMETHAMINE 30 MG/ML IJ SOLN
30.0000 mg | Freq: Once | INTRAMUSCULAR | Status: AC
  Administered 2015-01-01: 30 mg via INTRAVENOUS
  Filled 2015-01-01: qty 1

## 2015-01-01 MED ORDER — SODIUM CHLORIDE 0.9 % IV BOLUS (SEPSIS)
1000.0000 mL | Freq: Once | INTRAVENOUS | Status: AC
  Administered 2015-01-01: 1000 mL via INTRAVENOUS

## 2015-01-01 MED ORDER — DEXAMETHASONE SODIUM PHOSPHATE 10 MG/ML IJ SOLN
10.0000 mg | Freq: Once | INTRAMUSCULAR | Status: AC
  Administered 2015-01-01: 10 mg via INTRAVENOUS
  Filled 2015-01-01: qty 1

## 2015-01-01 NOTE — ED Provider Notes (Signed)
CSN: [id number]     Arrival date & time 01/01/15  1227 History   First MD Initiated Contact with Patient 01/01/15 1250     Chief Complaint  Patient presents with  . Headache     (Consider location/radiation/quality/duration/timing/severity/associated sxs/prior Treatment) Patient is a 44 y.o. female presenting with headaches. The history is provided by the patient.  Headache Pain location:  Generalized Radiates to:  Does not radiate Onset quality:  Gradual Duration:  1 day Timing:  Constant Progression:  Worsening Chronicity:  Recurrent Similar to prior headaches: yes   Context: activity and bright light   Relieved by:  Nothing Worsened by:  Nothing tried Ineffective treatments: Relpax. Associated symptoms: no abdominal pain, no fever and no neck pain     Past Medical History  Diagnosis Date  . GERD (gastroesophageal reflux disease)   . Hypertension   . Restless leg syndrome   . Obesity   . Diabetes mellitus without complication   . Inflammatory bowel diseases (IBD)   . Pancreatitis   . Migraine   . Hypercalcemia    Past Surgical History  Procedure Laterality Date  . Cholecystectomy    . Abdominal hysterectomy    . Appendectomy     No family history on file. History  Substance Use Topics  . Smoking status: Never Smoker   . Smokeless tobacco: Not on file  . Alcohol Use: No   OB History    No data available     Review of Systems  Constitutional: Negative for fever.  Gastrointestinal: Negative for abdominal pain.  Musculoskeletal: Negative for neck pain.  Neurological: Positive for headaches.  All other systems reviewed and are negative.     Allergies  Aspirin and Ibuprofen  Home Medications   Prior to Admission medications   Medication Sig Start Date End Date Taking? Authorizing Provider  acetaminophen (TYLENOL) 500 MG tablet Take 500 mg by mouth every 6 (six) hours as needed for pain.    Historical Provider, MD  dicyclomine (BENTYL) 10 MG capsule  Take 10 mg by mouth every 4 (four) hours as needed.    Historical Provider, MD  hydrochlorothiazide (HYDRODIURIL) 25 MG tablet Take 25 mg by mouth daily.    Historical Provider, MD  lisinopril (PRINIVIL,ZESTRIL) 5 MG tablet Take 5 mg by mouth daily.    Historical Provider, MD  lubiprostone (AMITIZA) 24 MCG capsule Take 24 mcg by mouth 2 (two) times daily with a meal.    Historical Provider, MD  metFORMIN (GLUCOPHAGE) 500 MG tablet Take by mouth 2 (two) times daily with a meal.    Historical Provider, MD  metoCLOPramide (REGLAN) 10 MG tablet Take 1 tablet (10 mg total) by mouth 4 (four) times daily. 01/25/13   Charles B. Bernette Mayers, MD  ondansetron (ZOFRAN-ODT) 4 MG disintegrating tablet Take 4 mg by mouth every 8 (eight) hours as needed for nausea or vomiting.    Historical Provider, MD  pantoprazole (PROTONIX) 40 MG tablet Take 40 mg by mouth daily.    Historical Provider, MD  promethazine (PHENERGAN) 25 MG tablet Take 1 tablet (25 mg total) by mouth every 6 (six) hours as needed for nausea or vomiting. 11/18/13   Elson Areas, PA-C  rOPINIRole (REQUIP) 0.5 MG tablet Take 0.5 mg by mouth 3 (three) times daily.    Historical Provider, MD  topiramate (TOPAMAX) 50 MG tablet Take 50 mg by mouth 2 (two) times daily.    Historical Provider, MD  TRAZODONE HCL PO Take by mouth at bedtime  as needed.    Historical Provider, MD  Vitamin D, Ergocalciferol, (DRISDOL) 50000 UNITS CAPS capsule Take 50,000 Units by mouth every 7 (seven) days.    Historical Provider, MD   BP 123/77 mmHg  Pulse 73  Temp(Src) 98.1 F (36.7 C) (Oral)  Resp 18  Ht 5\' 3"  (1.6 m)  Wt 270 lb (122.471 kg)  BMI 47.84 kg/m2  SpO2 100% Physical Exam  Constitutional: She is oriented to person, place, and time. She appears well-developed and well-nourished. No distress.  HENT:  Head: Normocephalic and atraumatic.  Eyes: EOM are normal. Pupils are equal, round, and reactive to light.  There is no papilledema on funduscopic exam.  Neck:  Normal range of motion. Neck supple.  Cardiovascular: Normal rate and regular rhythm.  Exam reveals no gallop and no friction rub.   No murmur heard. Pulmonary/Chest: Effort normal and breath sounds normal. No respiratory distress. She has no wheezes.  Abdominal: Soft. Bowel sounds are normal. She exhibits no distension. There is no tenderness.  Musculoskeletal: Normal range of motion.  Neurological: She is alert and oriented to person, place, and time. No cranial nerve deficit. She exhibits normal muscle tone. Coordination normal.  Skin: Skin is warm and dry. She is not diaphoretic.  Nursing note and vitals reviewed.   ED Course  Procedures (including critical care time) Labs Review Labs Reviewed - No data to display  Imaging Review No results found.   EKG Interpretation None      MDM   Final diagnoses:  None    Patient feeling better after IV fluids and medications.  Neurologic exam is intact patient appears appropriate for discharge. She is to take ibuprofen as needed for recurrent headaches and follow-up with her primary doctor.    Geoffery Lyonsouglas Aideliz Garmany, MD 01/01/15 1434

## 2015-01-01 NOTE — Discharge Instructions (Signed)

## 2015-01-01 NOTE — ED Notes (Signed)
Patient here with complaint of migraine headache that started yesterday, reports that the pain started in frontal area with nausea and vomiting and photophobia

## 2015-02-16 ENCOUNTER — Emergency Department (HOSPITAL_BASED_OUTPATIENT_CLINIC_OR_DEPARTMENT_OTHER)
Admission: EM | Admit: 2015-02-16 | Discharge: 2015-02-16 | Disposition: A | Discharge status: Home / Self Care | Payer: Medicare Other | Attending: Emergency Medicine | Admitting: Emergency Medicine

## 2015-02-16 ENCOUNTER — Emergency Department (HOSPITAL_BASED_OUTPATIENT_CLINIC_OR_DEPARTMENT_OTHER): Payer: Medicare Other

## 2015-02-16 ENCOUNTER — Encounter (HOSPITAL_BASED_OUTPATIENT_CLINIC_OR_DEPARTMENT_OTHER): Payer: Self-pay | Admitting: Emergency Medicine

## 2015-02-16 DIAGNOSIS — E669 Obesity, unspecified: Secondary | ICD-10-CM | POA: Diagnosis not present

## 2015-02-16 DIAGNOSIS — Z87448 Personal history of other diseases of urinary system: Secondary | ICD-10-CM | POA: Diagnosis not present

## 2015-02-16 DIAGNOSIS — K219 Gastro-esophageal reflux disease without esophagitis: Secondary | ICD-10-CM | POA: Insufficient documentation

## 2015-02-16 DIAGNOSIS — R1013 Epigastric pain: Secondary | ICD-10-CM | POA: Insufficient documentation

## 2015-02-16 DIAGNOSIS — G43909 Migraine, unspecified, not intractable, without status migrainosus: Secondary | ICD-10-CM | POA: Insufficient documentation

## 2015-02-16 DIAGNOSIS — I10 Essential (primary) hypertension: Secondary | ICD-10-CM | POA: Diagnosis not present

## 2015-02-16 DIAGNOSIS — R110 Nausea: Secondary | ICD-10-CM | POA: Diagnosis present

## 2015-02-16 DIAGNOSIS — R109 Unspecified abdominal pain: Secondary | ICD-10-CM

## 2015-02-16 DIAGNOSIS — E119 Type 2 diabetes mellitus without complications: Secondary | ICD-10-CM | POA: Insufficient documentation

## 2015-02-16 HISTORY — DX: Interstitial cystitis (chronic) without hematuria: N30.10

## 2015-02-16 LAB — CBC WITH DIFFERENTIAL/PLATELET
BASOS PCT: 1 % (ref 0–1)
Basophils Absolute: 0.1 10*3/uL (ref 0.0–0.1)
Eosinophils Absolute: 0.1 10*3/uL (ref 0.0–0.7)
Eosinophils Relative: 2 % (ref 0–5)
HCT: 26.2 % — ABNORMAL LOW (ref 36.0–46.0)
HEMOGLOBIN: 8.6 g/dL — AB (ref 12.0–15.0)
LYMPHS ABS: 2.8 10*3/uL (ref 0.7–4.0)
LYMPHS PCT: 49 % — AB (ref 12–46)
MCH: 21.4 pg — ABNORMAL LOW (ref 26.0–34.0)
MCHC: 32.8 g/dL (ref 30.0–36.0)
MCV: 65.2 fL — ABNORMAL LOW (ref 78.0–100.0)
MONO ABS: 0.4 10*3/uL (ref 0.1–1.0)
Monocytes Relative: 7 % (ref 3–12)
Neutro Abs: 2.3 10*3/uL (ref 1.7–7.7)
Neutrophils Relative %: 41 % — ABNORMAL LOW (ref 43–77)
Platelets: 205 10*3/uL (ref 150–400)
RBC: 4.02 MIL/uL (ref 3.87–5.11)
RDW: 15 % (ref 11.5–15.5)
WBC: 5.7 10*3/uL (ref 4.0–10.5)

## 2015-02-16 LAB — COMPREHENSIVE METABOLIC PANEL
ALT: 14 U/L (ref 0–35)
AST: 18 U/L (ref 0–37)
Albumin: 3.6 g/dL (ref 3.5–5.2)
Alkaline Phosphatase: 52 U/L (ref 39–117)
Anion gap: 6 (ref 5–15)
BUN: 17 mg/dL (ref 6–23)
CALCIUM: 9.1 mg/dL (ref 8.4–10.5)
CO2: 25 mmol/L (ref 19–32)
Chloride: 110 mmol/L (ref 96–112)
Creatinine, Ser: 0.74 mg/dL (ref 0.50–1.10)
GFR calc non Af Amer: >90 mL/min (ref >90)
Glucose, Bld: 107 mg/dL — ABNORMAL HIGH (ref 70–99)
POTASSIUM: 3.7 mmol/L (ref 3.5–5.1)
Sodium: 141 mmol/L (ref 135–145)
TOTAL PROTEIN: 7.1 g/dL (ref 6.0–8.3)
Total Bilirubin: 0.5 mg/dL (ref 0.3–1.2)

## 2015-02-16 LAB — URINALYSIS, ROUTINE W REFLEX MICROSCOPIC
Bilirubin Urine: NEGATIVE
Glucose, UA: NEGATIVE mg/dL
Ketones, ur: NEGATIVE mg/dL
LEUKOCYTES UA: NEGATIVE
NITRITE: NEGATIVE
PROTEIN: NEGATIVE mg/dL
Specific Gravity, Urine: 1.021 (ref 1.005–1.030)
UROBILINOGEN UA: 0.2 mg/dL (ref 0.0–1.0)
pH: 6.5 (ref 5.0–8.0)

## 2015-02-16 LAB — TROPONIN I: Troponin I: <0.03 ng/mL (ref <0.031)

## 2015-02-16 LAB — URINE MICROSCOPIC-ADD ON

## 2015-02-16 LAB — LIPASE, BLOOD: Lipase: 60 U/L — ABNORMAL HIGH (ref 11–59)

## 2015-02-16 LAB — I-STAT CG4 LACTIC ACID, ED: Lactic Acid, Venous: 0.41 mmol/L — ABNORMAL LOW (ref 0.5–2.0)

## 2015-02-16 MED ORDER — ONDANSETRON HCL 4 MG PO TABS: 4.0000 mg | ORAL_TABLET | Freq: Four times a day (QID) | ORAL | Status: AC

## 2015-02-16 MED ORDER — SODIUM CHLORIDE 0.9 % IV SOLN
Freq: Once | INTRAVENOUS | Status: AC
  Administered 2015-02-16: 08:00:00 via INTRAVENOUS

## 2015-02-16 MED ORDER — ONDANSETRON HCL 4 MG/2ML IJ SOLN
4.0000 mg | Freq: Once | INTRAMUSCULAR | Status: AC
  Administered 2015-02-16: 4 mg via INTRAVENOUS
  Filled 2015-02-16: qty 2

## 2015-02-16 MED ORDER — METOCLOPRAMIDE HCL 10 MG PO TABS: 10.0000 mg | ORAL_TABLET | Freq: Four times a day (QID) | ORAL | Status: AC | PRN

## 2015-02-16 NOTE — ED Notes (Signed)
Occult card canceled by provider and not ordered.

## 2015-02-16 NOTE — ED Provider Notes (Signed)
CSN: [id number]     Arrival date & time 02/16/15  1191 History   First MD Initiated Contact with Patient 02/16/15 0701     Chief Complaint  Patient presents with  . Nausea     (Consider location/radiation/quality/duration/timing/severity/associated sxs/prior Treatment) HPI Comments: Patient presents to the ER for evaluation of abdominal pain and fullness. She reports the symptoms began yesterday after eating. She has been having pain across her upper abdomen with nausea but no vomiting. She has not had any diarrhea or fever. Patient reports that the pain is mild but constant. She has not identified any alleviating or exacerbating factors other than the fact that it started after eating. She has had previous hysterectomy, cholecystectomy, appendectomy, oophorectomy.   Past Medical History  Diagnosis Date  . GERD (gastroesophageal reflux disease)   . Hypertension   . Restless leg syndrome   . Obesity   . Diabetes mellitus without complication   . Inflammatory bowel diseases (IBD)   . Pancreatitis   . Migraine   . Hypercalcemia   . Interstitial cystitis    Past Surgical History  Procedure Laterality Date  . Cholecystectomy    . Abdominal hysterectomy    . Appendectomy     History reviewed. No pertinent family history. History  Substance Use Topics  . Smoking status: Never Smoker   . Smokeless tobacco: Not on file  . Alcohol Use: No   OB History    No data available     Review of Systems  Gastrointestinal: Positive for nausea and abdominal pain.  All other systems reviewed and are negative.     Allergies  Aspirin and Ibuprofen  Home Medications   Prior to Admission medications   Medication Sig Start Date End Date Taking? Authorizing Provider  dicyclomine (BENTYL) 10 MG capsule Take 10 mg by mouth every 4 (four) hours as needed.   Yes Historical Provider, MD  hydrochlorothiazide (HYDRODIURIL) 25 MG tablet Take 25 mg by mouth daily.   Yes Historical Provider, MD   lisinopril (PRINIVIL,ZESTRIL) 5 MG tablet Take 5 mg by mouth daily.   Yes Historical Provider, MD  metFORMIN (GLUCOPHAGE) 500 MG tablet Take by mouth 2 (two) times daily with a meal.   Yes Historical Provider, MD  ondansetron (ZOFRAN-ODT) 4 MG disintegrating tablet Take 4 mg by mouth every 8 (eight) hours as needed for nausea or vomiting.   Yes Historical Provider, MD  pantoprazole (PROTONIX) 40 MG tablet Take 40 mg by mouth daily.   Yes Historical Provider, MD  rOPINIRole (REQUIP) 0.5 MG tablet Take 0.5 mg by mouth 3 (three) times daily.   Yes Historical Provider, MD  topiramate (TOPAMAX) 50 MG tablet Take 50 mg by mouth 2 (two) times daily.   Yes Historical Provider, MD  TRAZODONE HCL PO Take by mouth at bedtime as needed.   Yes Historical Provider, MD  Vitamin D, Ergocalciferol, (DRISDOL) 50000 UNITS CAPS capsule Take 50,000 Units by mouth every 7 (seven) days.   Yes Historical Provider, MD  acetaminophen (TYLENOL) 500 MG tablet Take 500 mg by mouth every 6 (six) hours as needed for pain.    Historical Provider, MD  lubiprostone (AMITIZA) 24 MCG capsule Take 24 mcg by mouth 2 (two) times daily with a meal.    Historical Provider, MD  metoCLOPramide (REGLAN) 10 MG tablet Take 1 tablet (10 mg total) by mouth 4 (four) times daily. 01/25/13   Susy Frizzle, MD  promethazine (PHENERGAN) 25 MG tablet Take 1 tablet (25 mg total) by  mouth every 6 (six) hours as needed for nausea or vomiting. 11/18/13   Elson AreasLeslie K Sofia, PA-C   Pulse 72  Temp(Src) 98.1 F (36.7 C) (Oral)  Resp 18  Ht 5\' 3"  (1.6 m)  Wt 270 lb (122.471 kg)  BMI 47.84 kg/m2  SpO2 100% Physical Exam  Constitutional: She is oriented to person, place, and time. She appears well-developed and well-nourished. No distress.  HENT:  Head: Normocephalic and atraumatic.  Right Ear: Hearing normal.  Left Ear: Hearing normal.  Nose: Nose normal.  Mouth/Throat: Oropharynx is clear and moist and mucous membranes are normal.  Eyes: Conjunctivae  and EOM are normal. Pupils are equal, round, and reactive to light.  Neck: Normal range of motion. Neck supple.  Cardiovascular: Regular rhythm, S1 normal and S2 normal.  Exam reveals no gallop and no friction rub.   No murmur heard. Pulmonary/Chest: Effort normal and breath sounds normal. No respiratory distress. She exhibits no tenderness.  Abdominal: Soft. Normal appearance and bowel sounds are normal. There is no hepatosplenomegaly. There is tenderness in the epigastric area. There is no rebound, no guarding, no tenderness at McBurney's point and negative Murphy's sign. No hernia.  Musculoskeletal: Normal range of motion.  Neurological: She is alert and oriented to person, place, and time. She has normal strength. No cranial nerve deficit or sensory deficit. Coordination normal. GCS eye subscore is 4. GCS verbal subscore is 5. GCS motor subscore is 6.  Skin: Skin is warm, dry and intact. No rash noted. No cyanosis.  Psychiatric: She has a normal mood and affect. Her speech is normal and behavior is normal. Thought content normal.  Nursing note and vitals reviewed.   ED Course  Procedures (including critical care time) Labs Review Labs Reviewed  URINALYSIS, ROUTINE W REFLEX MICROSCOPIC - Abnormal; Notable for the following:    APPearance CLOUDY (*)    Hgb urine dipstick TRACE (*)    All other components within normal limits  CBC WITH DIFFERENTIAL/PLATELET - Abnormal; Notable for the following:    Hemoglobin 8.6 (*)    HCT 26.2 (*)    MCV 65.2 (*)    MCH 21.4 (*)    Neutrophils Relative % 41 (*)    Lymphocytes Relative 49 (*)    All other components within normal limits  COMPREHENSIVE METABOLIC PANEL - Abnormal; Notable for the following:    Glucose, Bld 107 (*)    All other components within normal limits  LIPASE, BLOOD - Abnormal; Notable for the following:    Lipase 60 (*)    All other components within normal limits  URINE MICROSCOPIC-ADD ON - Abnormal; Notable for the  following:    Squamous Epithelial / LPF FEW (*)    Bacteria, UA MANY (*)    All other components within normal limits  I-STAT CG4 LACTIC ACID, ED - Abnormal; Notable for the following:    Lactic Acid, Venous 0.41 (*)    All other components within normal limits  TROPONIN I  OCCULT BLOOD X 1 CARD TO LAB, STOOL    Imaging Review Dg Abd Acute W/chest  02/16/2015   CLINICAL DATA:  Nausea and abdominal pain.  EXAM: ACUTE ABDOMEN SERIES (ABDOMEN 2 VIEW & CHEST 1 VIEW)  COMPARISON:  10/29/2014 and CT of the abdomen on 01/03/2015  FINDINGS: The heart size and mediastinal contours are within normal limits. There is no evidence of pulmonary edema, consolidation, pneumothorax, nodule or pleural fluid.  Abdominal films show a normal bowel gas pattern. No evidence  of bowel obstruction or ileus. No free air is identified. Clips are present related to prior cholecystectomy. No abnormal calcifications are seen. Sclerosis of both sacroiliac joints present consistent with sacroiliitis. This appears stable compared to prior x-ray and CT evaluation.  IMPRESSION: No acute findings.  Stable bilateral sacroiliitis.   Electronically Signed   By: Irish Lack M.D.   On: 02/16/2015 08:01     EKG Interpretation   Date/Time:  Wednesday February 16 2015 07:29:14 EDT Ventricular Rate:  69 PR Interval:  246 QRS Duration: 106 QT Interval:  422 QTC Calculation: 452 R Axis:   57 Text Interpretation:  Sinus rhythm with 1st degree A-V block Incomplete  right bundle branch block Borderline ECG No previous tracing Confirmed by  POLLINA  MD, CHRISTOPHER ([id number]) on 02/16/2015 7:36:27 AM      MDM   Final diagnoses:  Abdominal pain    Patient presents to the ER for evaluation of abdominal discomfort. She reports a sensation of fullness after eating yesterday and has had nausea and abdominal pain. Examination revealed very mild midepigastric tenderness without guarding or rebound. Cardiac evaluation is unremarkable.  Patient has a normal white blood cell count 5.7. She is anemic with a hemoglobin of 8.6. She declined rectal exam, reports that her hemoglobin is normally lower than 8.6. She has not seen any melena or rectal bleeding.  Comprehensive metabolic panel was normal. Lipase was 60, nonspecific. Urinalysis is equivocal.  Feeling better after IV fluids and Zofran. She reports that she has follow-up next week with gastroenterology. She'll be treated symptomatically until follow-up, return if symptoms worsen.    Gilda Crease, MD 02/16/15 [phone]

## 2015-02-16 NOTE — ED Notes (Signed)
Pt reports nausea that started yesterday, denies vomiting or diarrhea, states she just feels full

## 2015-02-16 NOTE — Discharge Instructions (Signed)

## 2015-02-16 NOTE — ED Notes (Signed)
Pt reports history of interstitial cystis and receives weekly antibiotic bladder irrigation every Friday. Pt states that her upper abdominal pain does not feel like anything to do with her bladder. She states she just feels full

## 2015-03-17 ENCOUNTER — Encounter (HOSPITAL_BASED_OUTPATIENT_CLINIC_OR_DEPARTMENT_OTHER): Payer: Self-pay | Admitting: *Deleted

## 2015-03-17 ENCOUNTER — Emergency Department (HOSPITAL_BASED_OUTPATIENT_CLINIC_OR_DEPARTMENT_OTHER)
Admission: EM | Admit: 2015-03-17 | Discharge: 2015-03-17 | Disposition: A | Discharge status: Home / Self Care | Payer: Medicare Other | Attending: Emergency Medicine | Admitting: Emergency Medicine

## 2015-03-17 DIAGNOSIS — Z9049 Acquired absence of other specified parts of digestive tract: Secondary | ICD-10-CM | POA: Insufficient documentation

## 2015-03-17 DIAGNOSIS — Z87448 Personal history of other diseases of urinary system: Secondary | ICD-10-CM | POA: Insufficient documentation

## 2015-03-17 DIAGNOSIS — E119 Type 2 diabetes mellitus without complications: Secondary | ICD-10-CM | POA: Insufficient documentation

## 2015-03-17 DIAGNOSIS — Z79899 Other long term (current) drug therapy: Secondary | ICD-10-CM | POA: Insufficient documentation

## 2015-03-17 DIAGNOSIS — E669 Obesity, unspecified: Secondary | ICD-10-CM | POA: Insufficient documentation

## 2015-03-17 DIAGNOSIS — N390 Urinary tract infection, site not specified: Secondary | ICD-10-CM | POA: Insufficient documentation

## 2015-03-17 DIAGNOSIS — Z792 Long term (current) use of antibiotics: Secondary | ICD-10-CM | POA: Diagnosis not present

## 2015-03-17 DIAGNOSIS — M549 Dorsalgia, unspecified: Secondary | ICD-10-CM | POA: Diagnosis present

## 2015-03-17 DIAGNOSIS — Z90710 Acquired absence of both cervix and uterus: Secondary | ICD-10-CM | POA: Insufficient documentation

## 2015-03-17 DIAGNOSIS — K219 Gastro-esophageal reflux disease without esophagitis: Secondary | ICD-10-CM | POA: Diagnosis not present

## 2015-03-17 DIAGNOSIS — I10 Essential (primary) hypertension: Secondary | ICD-10-CM | POA: Diagnosis not present

## 2015-03-17 DIAGNOSIS — Z8669 Personal history of other diseases of the nervous system and sense organs: Secondary | ICD-10-CM | POA: Insufficient documentation

## 2015-03-17 LAB — URINALYSIS, ROUTINE W REFLEX MICROSCOPIC
Bilirubin Urine: NEGATIVE
GLUCOSE, UA: NEGATIVE mg/dL
Ketones, ur: NEGATIVE mg/dL
Nitrite: NEGATIVE
Protein, ur: NEGATIVE mg/dL
SPECIFIC GRAVITY, URINE: 1.023 (ref 1.005–1.030)
UROBILINOGEN UA: 0.2 mg/dL (ref 0.0–1.0)
pH: 6 (ref 5.0–8.0)

## 2015-03-17 LAB — URINE MICROSCOPIC-ADD ON

## 2015-03-17 MED ORDER — CEPHALEXIN 500 MG PO CAPS: 500.0000 mg | ORAL_CAPSULE | Freq: Two times a day (BID) | ORAL | Status: AC

## 2015-03-17 MED ORDER — HYDROCODONE-ACETAMINOPHEN 5-325 MG PO TABS: 1.0000 | ORAL_TABLET | Freq: Four times a day (QID) | ORAL | Status: AC | PRN

## 2015-03-17 NOTE — ED Notes (Signed)
Back pain and dysuria for 2 days.

## 2015-03-17 NOTE — ED Provider Notes (Signed)
CSN: [id number]     Arrival date & time 03/17/15  1541 History   First MD Initiated Contact with Patient 03/17/15 1636     Chief Complaint  Patient presents with  . Back Pain     (Consider location/radiation/quality/duration/timing/severity/associated sxs/prior Treatment) HPI Comments: Pt comes in with lower back pain and urinary frequency and urgency for 2 days. Denies fever, vomiting and diarrhea. Hasn't tried anything for the symptoms. States that it does hurt when she moves. No vaginal discharge. Has a history of hysterectomy  The history is provided by the patient. No language interpreter was used.    Past Medical History  Diagnosis Date  . GERD (gastroesophageal reflux disease)   . Hypertension   . Restless leg syndrome   . Obesity   . Diabetes mellitus without complication   . Inflammatory bowel diseases (IBD)   . Pancreatitis   . Migraine   . Hypercalcemia   . Interstitial cystitis    Past Surgical History  Procedure Laterality Date  . Cholecystectomy    . Abdominal hysterectomy    . Appendectomy     No family history on file. History  Substance Use Topics  . Smoking status: Never Smoker   . Smokeless tobacco: Not on file  . Alcohol Use: No   OB History    No data available     Review of Systems  All other systems reviewed and are negative.     Allergies  Aspirin and Ibuprofen  Home Medications   Prior to Admission medications   Medication Sig Start Date End Date Taking? Authorizing Provider  acetaminophen (TYLENOL) 500 MG tablet Take 500 mg by mouth every 6 (six) hours as needed for pain.    Historical Provider, MD  cephALEXin (KEFLEX) 500 MG capsule Take 1 capsule (500 mg total) by mouth 2 (two) times daily. 03/17/15   Teressa Lower, NP  dicyclomine (BENTYL) 10 MG capsule Take 10 mg by mouth every 4 (four) hours as needed.    Historical Provider, MD  hydrochlorothiazide (HYDRODIURIL) 25 MG tablet Take 25 mg by mouth daily.    Historical  Provider, MD  HYDROcodone-acetaminophen (NORCO/VICODIN) 5-325 MG per tablet Take 1-2 tablets by mouth every 6 (six) hours as needed. 03/17/15   Teressa Lower, NP  lisinopril (PRINIVIL,ZESTRIL) 5 MG tablet Take 5 mg by mouth daily.    Historical Provider, MD  lubiprostone (AMITIZA) 24 MCG capsule Take 24 mcg by mouth 2 (two) times daily with a meal.    Historical Provider, MD  metFORMIN (GLUCOPHAGE) 500 MG tablet Take by mouth 2 (two) times daily with a meal.    Historical Provider, MD  metoCLOPramide (REGLAN) 10 MG tablet Take 1 tablet (10 mg total) by mouth every 6 (six) hours as needed for nausea (nausea/headache). 02/16/15   Gilda Crease, MD  ondansetron (ZOFRAN) 4 MG tablet Take 1 tablet (4 mg total) by mouth every 6 (six) hours. 02/16/15   Gilda Crease, MD  pantoprazole (PROTONIX) 40 MG tablet Take 40 mg by mouth daily.    Historical Provider, MD  rOPINIRole (REQUIP) 0.5 MG tablet Take 0.5 mg by mouth 3 (three) times daily.    Historical Provider, MD  topiramate (TOPAMAX) 50 MG tablet Take 50 mg by mouth 2 (two) times daily.    Historical Provider, MD  TRAZODONE HCL PO Take by mouth at bedtime as needed.    Historical Provider, MD  Vitamin D, Ergocalciferol, (DRISDOL) 50000 UNITS CAPS capsule Take 50,000 Units by mouth every  7 (seven) days.    Historical Provider, MD   BP 139/75 mmHg  Pulse 75  Temp(Src) 98.1 F (36.7 C) (Oral)  Resp 20  Ht 5\' 3"  (1.6 m)  Wt 265 lb (120.203 kg)  BMI 46.95 kg/m2  SpO2 100% Physical Exam  Constitutional: She is oriented to person, place, and time. She appears well-developed and well-nourished.  Cardiovascular: Normal rate and regular rhythm.   Pulmonary/Chest: Effort normal and breath sounds normal.  Abdominal: Soft. Bowel sounds are normal. She exhibits no distension. There is no CVA tenderness.  Musculoskeletal: Normal range of motion.  Lower lumbar spine tenderness  Neurological: She is alert and oriented to person, place, and  time.  Skin: Skin is warm and dry.  Psychiatric: She has a normal mood and affect.  Nursing note and vitals reviewed.   ED Course  Procedures (including critical care time) Labs Review Labs Reviewed  URINALYSIS, ROUTINE W REFLEX MICROSCOPIC - Abnormal; Notable for the following:    APPearance CLOUDY (*)    Hgb urine dipstick TRACE (*)    Leukocytes, UA SMALL (*)    All other components within normal limits  URINE MICROSCOPIC-ADD ON - Abnormal; Notable for the following:    Squamous Epithelial / LPF FEW (*)    All other components within normal limits  URINE CULTURE    Imaging Review No results found.   EKG Interpretation None      MDM   Final diagnoses:  UTI (lower urinary tract infection)    Will treat for uti. Possibly and contaminated urine, but with symptom will treat at this time. Urine sent for culture. Pt given keflex and hydrocodone for pain    Teressa LowerVrinda Mena Lienau, NP 03/17/15 1705  Elwin MochaBlair Walden, MD 03/18/15 [phone]

## 2015-03-17 NOTE — Discharge Instructions (Signed)

## 2015-03-18 LAB — URINE CULTURE
Colony Count: NO GROWTH
Culture: NO GROWTH

## 2015-11-07 IMAGING — CR DG ABDOMEN ACUTE W/ 1V CHEST
4 series · 4 of 4 positions shown · non-contrast
Comparison: 10/29/2014 and CT of the abdomen on 01/03/2015

CLINICAL DATA: Nausea and abdominal pain.

EXAM:
ACUTE ABDOMEN SERIES (ABDOMEN 2 VIEW & CHEST 1 VIEW)

[w chest pa]
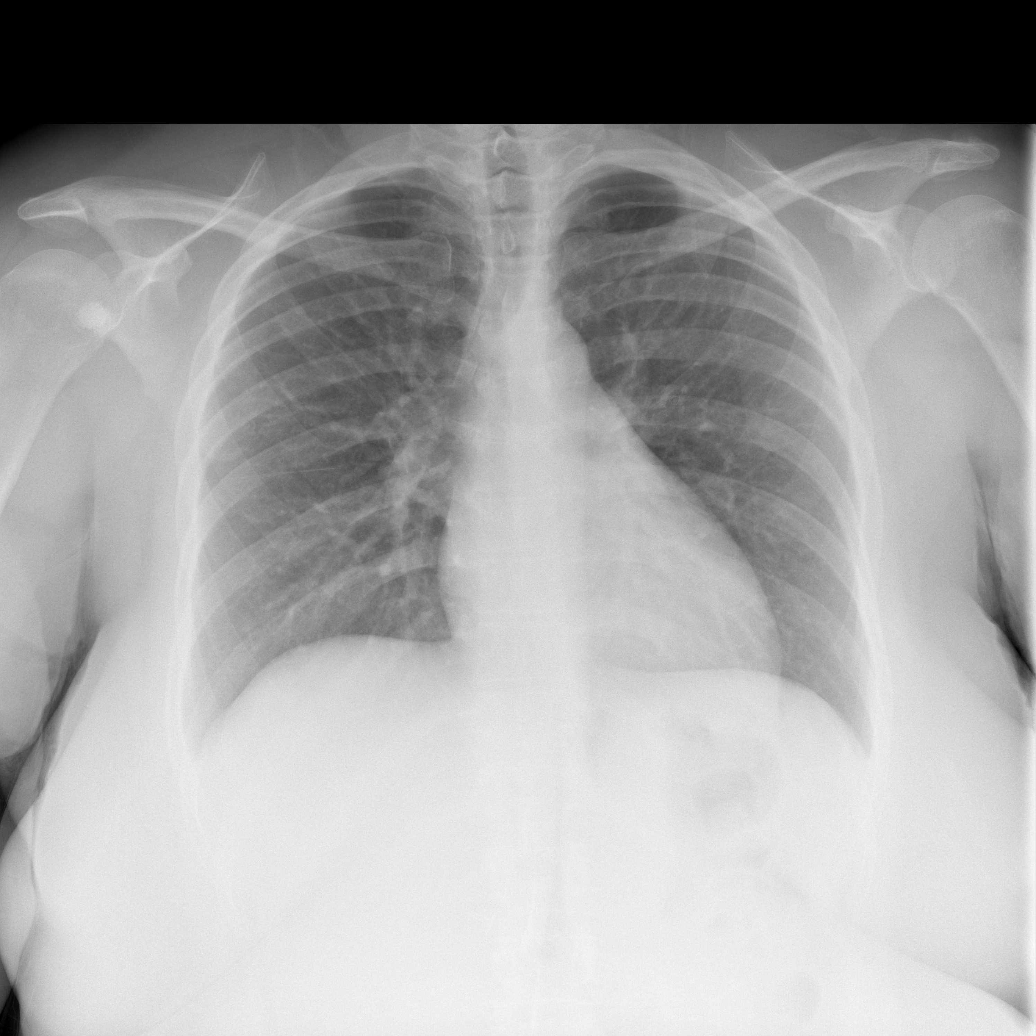

[w abdomen upright]
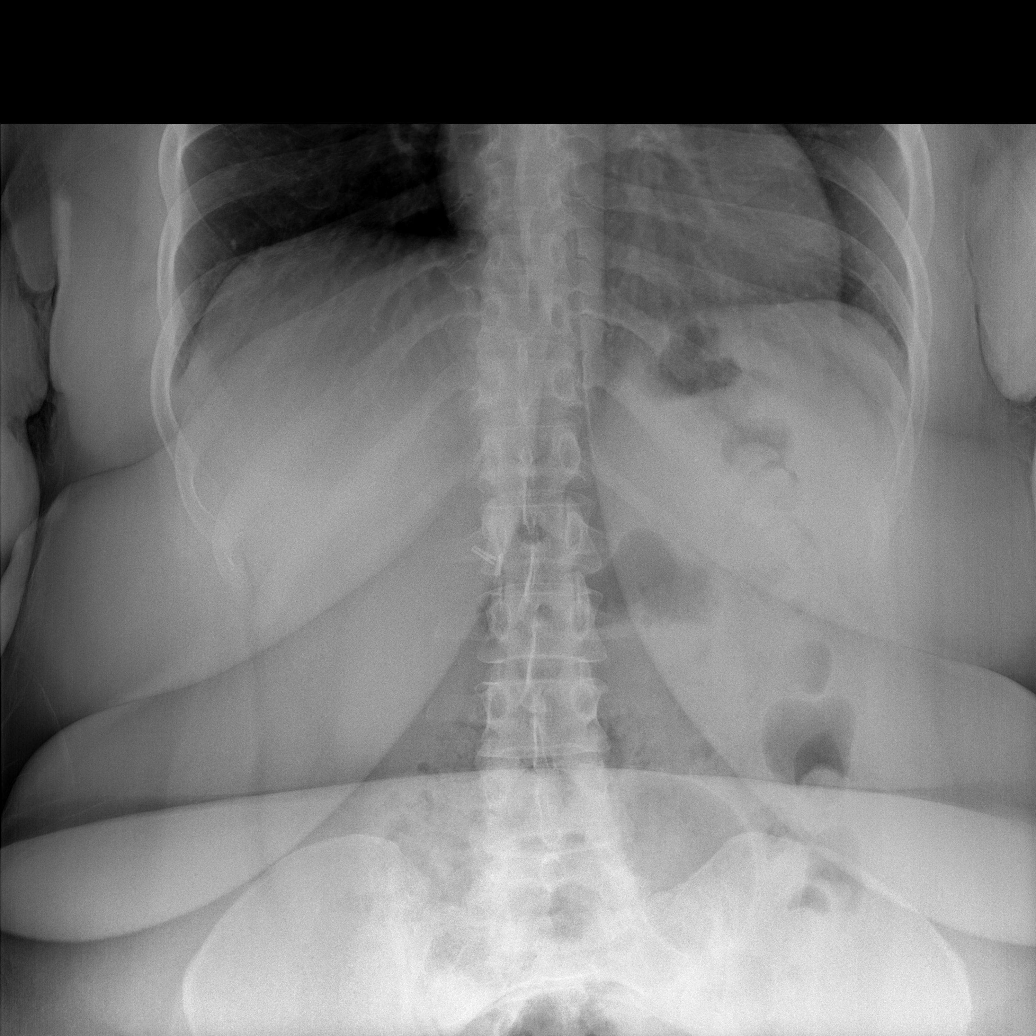

[t abdomen supine (1 of 2)]
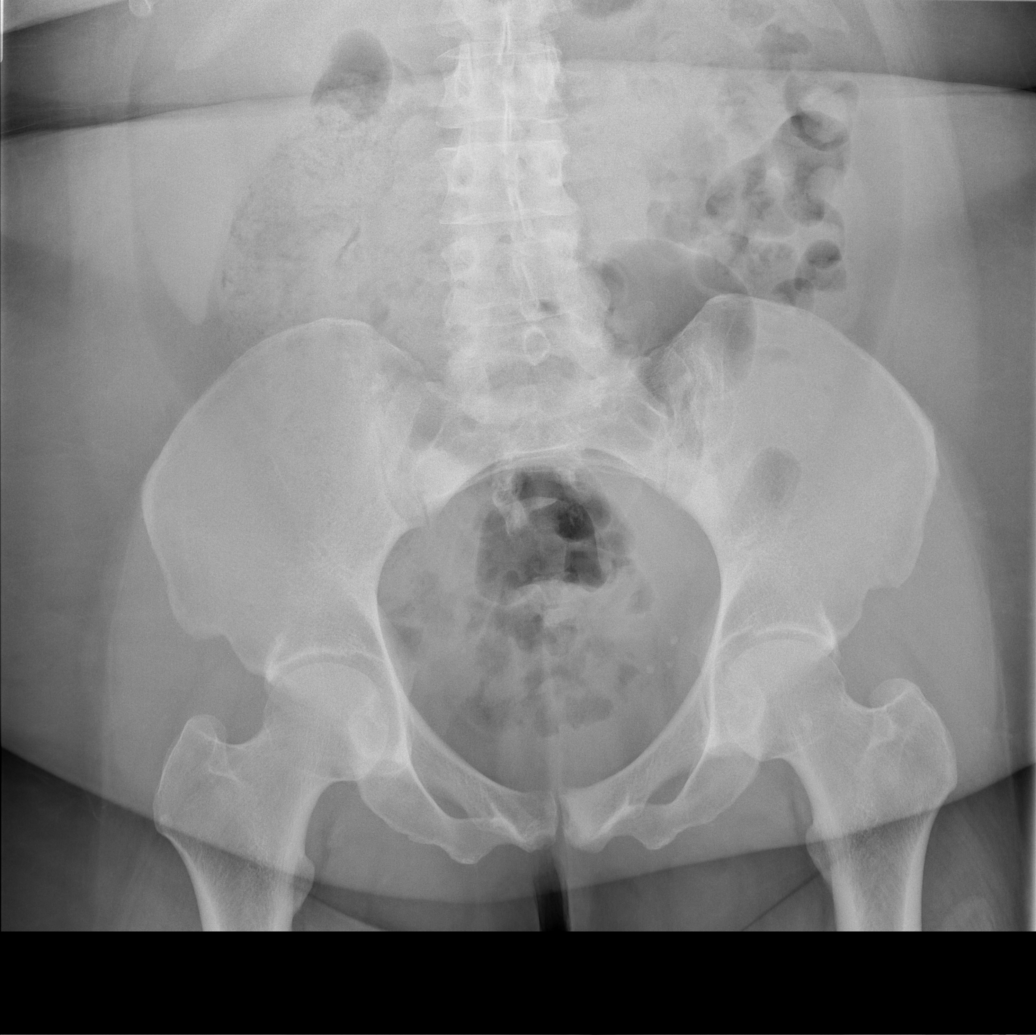

[t abdomen supine (2 of 2)]
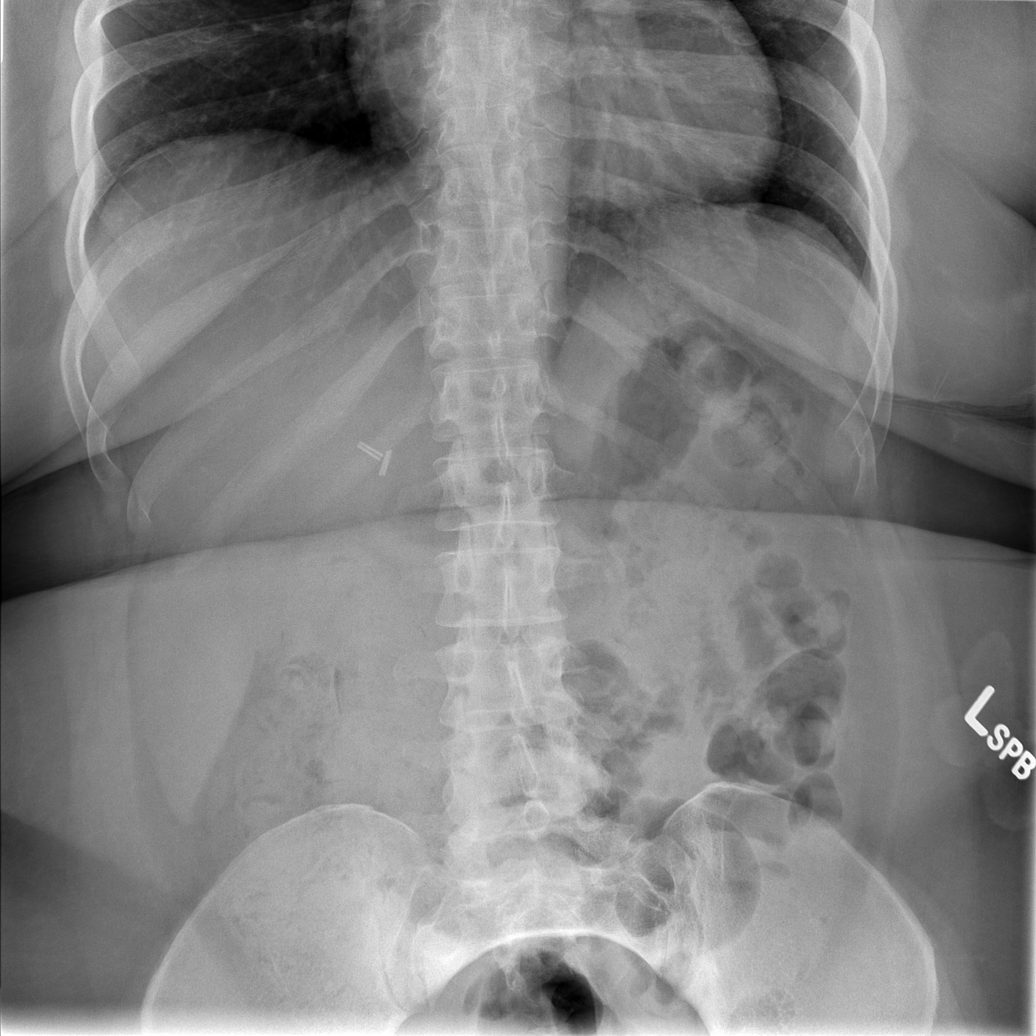

[4 of 4 positions shown; findings below may reference images not displayed]

FINDINGS: The heart size and mediastinal contours are within normal limits.
There is no evidence of pulmonary edema, consolidation,
pneumothorax, nodule or pleural fluid.

Abdominal films show a normal bowel gas pattern. No evidence of
bowel obstruction or ileus. No free air is identified. Clips are
present related to prior cholecystectomy. No abnormal calcifications
are seen. Sclerosis of both sacroiliac joints present consistent
with sacroiliitis. This appears stable compared to prior x-ray and
CT evaluation.
IMPRESSION: No acute findings.  Stable bilateral sacroiliitis.

## 2016-03-25 ENCOUNTER — Emergency Department (HOSPITAL_BASED_OUTPATIENT_CLINIC_OR_DEPARTMENT_OTHER)
Admission: EM | Admit: 2016-03-25 | Discharge: 2016-03-25 | Disposition: A | Discharge status: Home / Self Care | Payer: Medicare Other | Attending: Emergency Medicine | Admitting: Emergency Medicine

## 2016-03-25 ENCOUNTER — Encounter (HOSPITAL_BASED_OUTPATIENT_CLINIC_OR_DEPARTMENT_OTHER): Payer: Self-pay | Admitting: *Deleted

## 2016-03-25 DIAGNOSIS — I10 Essential (primary) hypertension: Secondary | ICD-10-CM | POA: Insufficient documentation

## 2016-03-25 DIAGNOSIS — Z7984 Long term (current) use of oral hypoglycemic drugs: Secondary | ICD-10-CM | POA: Diagnosis not present

## 2016-03-25 DIAGNOSIS — E669 Obesity, unspecified: Secondary | ICD-10-CM | POA: Insufficient documentation

## 2016-03-25 DIAGNOSIS — M545 Low back pain, unspecified: Secondary | ICD-10-CM

## 2016-03-25 DIAGNOSIS — Z79899 Other long term (current) drug therapy: Secondary | ICD-10-CM | POA: Insufficient documentation

## 2016-03-25 DIAGNOSIS — E119 Type 2 diabetes mellitus without complications: Secondary | ICD-10-CM | POA: Diagnosis not present

## 2016-03-25 HISTORY — DX: Unspecified osteoarthritis, unspecified site: M19.90

## 2016-03-25 MED ORDER — NAPROXEN 500 MG PO TABS: 500.0000 mg | ORAL_TABLET | Freq: Two times a day (BID) | ORAL | Status: AC

## 2016-03-25 MED ORDER — ONDANSETRON 8 MG PO TBDP
8.0000 mg | ORAL_TABLET | Freq: Once | ORAL | Status: AC
  Administered 2016-03-25: 8 mg via ORAL
  Filled 2016-03-25: qty 1

## 2016-03-25 MED ORDER — KETOROLAC TROMETHAMINE 30 MG/ML IJ SOLN
30.0000 mg | Freq: Once | INTRAMUSCULAR | Status: AC
  Administered 2016-03-25: 30 mg via INTRAMUSCULAR
  Filled 2016-03-25: qty 1

## 2016-03-25 NOTE — Discharge Instructions (Signed)
There does not appear to be an emergent cause for your symptoms at this time. Please take the medication as prescribed. Follow-up with your doctor next week for reevaluation. Return to ED for new or worsening symptoms as we discussed.  Back Pain, Adult Back pain is very common in adults.The cause of back pain is rarely dangerous and the pain often gets better over time.The cause of your back pain may not be known. Some common causes of back pain include:  Strain of the muscles or ligaments supporting the spine.  Wear and tear (degeneration) of the spinal disks.  Arthritis.  Direct injury to the back. For many people, back pain may return. Since back pain is rarely dangerous, most people can learn to manage this condition on their own. HOME CARE INSTRUCTIONS Watch your back pain for any changes. The following actions may help to lessen any discomfort you are feeling:  Remain active. It is stressful on your back to sit or stand in one place for long periods of time. Do not sit, drive, or stand in one place for more than 30 minutes at a time. Take short walks on even surfaces as soon as you are able.Try to increase the length of time you walk each day.  Exercise regularly as directed by your health care provider. Exercise helps your back heal faster. It also helps avoid future injury by keeping your muscles strong and flexible.  Do not stay in bed.Resting more than 1-2 days can delay your recovery.  Pay attention to your body when you bend and lift. The most comfortable positions are those that put less stress on your recovering back. Always use proper lifting techniques, including:  Bending your knees.  Keeping the load close to your body.  Avoiding twisting.  Find a comfortable position to sleep. Use a firm mattress and lie on your side with your knees slightly bent. If you lie on your back, put a pillow under your knees.  Avoid feeling anxious or stressed.Stress increases muscle  tension and can worsen back pain.It is important to recognize when you are anxious or stressed and learn ways to manage it, such as with exercise.  Take medicines only as directed by your health care provider. Over-the-counter medicines to reduce pain and inflammation are often the most helpful.Your health care provider may prescribe muscle relaxant drugs.These medicines help dull your pain so you can more quickly return to your normal activities and healthy exercise.  Apply ice to the injured area:  Put ice in a plastic bag.  Place a towel between your skin and the bag.  Leave the ice on for 20 minutes, 2-3 times a day for the first 2-3 days. After that, ice and heat may be alternated to reduce pain and spasms.  Maintain a healthy weight. Excess weight puts extra stress on your back and makes it difficult to maintain good posture. SEEK MEDICAL CARE IF:  You have pain that is not relieved with rest or medicine.  You have increasing pain going down into the legs or buttocks.  You have pain that does not improve in one week.  You have night pain.  You lose weight.  You have a fever or chills. SEEK IMMEDIATE MEDICAL CARE IF:   You develop new bowel or bladder control problems.  You have unusual weakness or numbness in your arms or legs.  You develop nausea or vomiting.  You develop abdominal pain.  You feel faint.   This information is not intended  to replace advice given to you by your health care provider. Make sure you discuss any questions you have with your health care provider.   Document Released: 11/12/2005 Document Revised: 12/03/2014 Document Reviewed: 03/16/2014 Elsevier Interactive Patient Education [employer].

## 2016-03-25 NOTE — ED Provider Notes (Signed)
CSN: [id number]     Arrival date & time 03/25/16  1209 History   First MD Initiated Contact with Patient 03/25/16 1358     Chief Complaint  Patient presents with  . Back Pain     (Consider location/radiation/quality/duration/timing/severity/associated sxs/prior Treatment) HPI Selena Hopkins is a 45 y.o. female with a history of obesity, diabetes, comes in for evaluation of low back pain. Patient reports and is having ongoing over the past 2 days. She characterizes this sensation is burning sensation in her bilateral lower back, worse with walking. She denies any injury, trauma or other overuse. No fevers, chills, urinary symptoms, nausea or vomiting, numbness or weakness, IV drug use, personally history of cancer, saddle paresthesia. She has tried Tylenol once without complete relief. No other aggravating or modifying factors.  Past Medical History  Diagnosis Date  . GERD (gastroesophageal reflux disease)   . Hypertension   . Restless leg syndrome   . Obesity   . Diabetes mellitus without complication (HCC)   . Inflammatory bowel diseases (IBD)   . Pancreatitis   . Migraine   . Hypercalcemia   . Interstitial cystitis   . Arthritis    Past Surgical History  Procedure Laterality Date  . Cholecystectomy    . Abdominal hysterectomy    . Appendectomy     No family history on file. Social History  Substance Use Topics  . Smoking status: Never Smoker   . Smokeless tobacco: Never Used  . Alcohol Use: No   OB History    No data available     Review of Systems A 10 point review of systems was completed and was negative except for pertinent positives and negatives as mentioned in the history of present illness     Allergies  Aspirin and Ibuprofen  Home Medications   Prior to Admission medications   Medication Sig Start Date End Date Taking? Authorizing Provider  lisinopril (PRINIVIL,ZESTRIL) 5 MG tablet Take 5 mg by mouth daily.   Yes Historical Provider, MD   lisinopril-hydrochlorothiazide (PRINZIDE,ZESTORETIC) 10-12.5 MG tablet Take 1 tablet by mouth daily.   Yes Historical Provider, MD  lubiprostone (AMITIZA) 24 MCG capsule Take 24 mcg by mouth 2 (two) times daily with a meal.   Yes Historical Provider, MD  metFORMIN (GLUCOPHAGE) 500 MG tablet Take by mouth 2 (two) times daily with a meal.   Yes Historical Provider, MD  pantoprazole (PROTONIX) 40 MG tablet Take 40 mg by mouth daily.   Yes Historical Provider, MD  rOPINIRole (REQUIP) 0.5 MG tablet Take 0.5 mg by mouth 3 (three) times daily.   Yes Historical Provider, MD  topiramate (TOPAMAX) 50 MG tablet Take 50 mg by mouth 2 (two) times daily.   Yes Historical Provider, MD  Vitamin D, Ergocalciferol, (DRISDOL) 50000 UNITS CAPS capsule Take 50,000 Units by mouth every 7 (seven) days.   Yes Historical Provider, MD  acetaminophen (TYLENOL) 500 MG tablet Take 500 mg by mouth every 6 (six) hours as needed for pain.    Historical Provider, MD  cephALEXin (KEFLEX) 500 MG capsule Take 1 capsule (500 mg total) by mouth 2 (two) times daily. 03/17/15   Teressa Lower, NP  dicyclomine (BENTYL) 10 MG capsule Take 10 mg by mouth every 4 (four) hours as needed.    Historical Provider, MD  hydrochlorothiazide (HYDRODIURIL) 25 MG tablet Take 25 mg by mouth daily.    Historical Provider, MD  HYDROcodone-acetaminophen (NORCO/VICODIN) 5-325 MG per tablet Take 1-2 tablets by mouth every 6 (six) hours  as needed. 03/17/15   Teressa LowerVrinda Pickering, NP  metoCLOPramide (REGLAN) 10 MG tablet Take 1 tablet (10 mg total) by mouth every 6 (six) hours as needed for nausea (nausea/headache). 02/16/15   Gilda Creasehristopher J Pollina, MD  naproxen (NAPROSYN) 500 MG tablet Take 1 tablet (500 mg total) by mouth 2 (two) times daily. 03/25/16   Joycie PeekBenjamin Adlai Sinning, PA-C  ondansetron (ZOFRAN) 4 MG tablet Take 1 tablet (4 mg total) by mouth every 6 (six) hours. 02/16/15   Gilda Creasehristopher J Pollina, MD  TRAZODONE HCL PO Take by mouth at bedtime as needed.     Historical Provider, MD   BP 135/73 mmHg  Pulse 69  Temp(Src) 98.6 F (37 C) (Oral)  Resp 20  Ht 5\' 3"  (1.6 m)  Wt 117.935 kg  BMI 46.07 kg/m2  SpO2 100% Physical Exam  Constitutional: She appears well-developed and well-nourished. No distress.  Obese African-American female  HENT:  Head: Normocephalic and atraumatic.  Eyes: Conjunctivae and EOM are normal. Right eye exhibits no discharge. Left eye exhibits no discharge. No scleral icterus.  Neck: Normal range of motion. Neck supple.  Pulmonary/Chest: Effort normal. No respiratory distress.  Abdominal: Soft. She exhibits no distension. There is no tenderness.  Musculoskeletal: Normal range of motion. She exhibits tenderness.  Very mild, Diffuse tenderness in bil lumbar paraspinal musculature. No focal midline bony or spinous process tenderness. No crepitus, tenting, rash or other skin abnormality. Full active range of motion of CTL spine. Full active range of motion of all extremities.  Neurological:  Motor strength and sensation appear baseline for patient. Able to stand on tiptoe, dorsiflex great toe. Gait baseline.  Skin: She is not diaphoretic.    ED Course  Procedures (including critical care time) Labs Review Labs Reviewed - No data to display  Imaging Review No results found. I have personally reviewed and evaluated these images and lab results as part of my medical decision-making.   EKG Interpretation None      MDM  Patient with back painFor the past 2 days.  No neurological deficits and normal neuro exam.  Patient can walk but states is painful.  No loss of bowel or bladder control.  No concern for cauda equina.  No fever, night sweats, weight loss, h/o cancer, IVDU.  No urinary symptoms concerning for UTI, benign abdominal exam. RICE protocol and pain medicine indicated and discussed with patient.   Final diagnoses:  Bilateral low back pain without sciatica        Joycie PeekBenjamin Kyreese Chio, PA-C 03/25/16  1448  Alvira MondayErin Schlossman, MD 03/27/16 [phone]

## 2016-03-25 NOTE — ED Notes (Signed)
Back pain since yesterday. Ambulatory to triage with steady gait. Denies urinary Sx. Denies fall or injury

## 2022-07-19 ENCOUNTER — Other Ambulatory Visit (HOSPITAL_COMMUNITY): Payer: Self-pay | Admitting: Family Medicine

## 2022-07-19 DIAGNOSIS — R072 Precordial pain: Secondary | ICD-10-CM

## 2022-07-25 ENCOUNTER — Telehealth (HOSPITAL_COMMUNITY): Payer: Self-pay | Admitting: Emergency Medicine

## 2022-07-25 NOTE — Telephone Encounter (Signed)
Unable to leave vm Daven Montz RN Navigator Cardiac Imaging [facility] [phone] Office  [phone] Cell  

## 2022-07-26 ENCOUNTER — Telehealth (HOSPITAL_COMMUNITY): Payer: Self-pay | Admitting: Emergency Medicine

## 2022-07-26 NOTE — Telephone Encounter (Signed)
Unable to leave vm Mylissa Lambe RN Navigator Cardiac Imaging [facility] [phone] Office  [phone] Cell  

## 2022-07-27 ENCOUNTER — Ambulatory Visit (HOSPITAL_COMMUNITY): Admission: RE | Admit: 2022-07-27 | Payer: Medicare Other | Source: Ambulatory Visit

## 2022-07-31 ENCOUNTER — Encounter (HOSPITAL_COMMUNITY): Payer: Self-pay
